# Patient Record
Sex: Female | Born: 1970
Health system: Southern US, Community
[De-identification: ages and names within clinical notes are randomized; demographics above are authoritative.]

## PROBLEM LIST (undated history)

## (undated) DIAGNOSIS — T7840XA Allergy, unspecified, initial encounter: Secondary | ICD-10-CM

## (undated) HISTORY — PX: BREAST BIOPSY: SHX20

## (undated) HISTORY — DX: Allergy, unspecified, initial encounter: T78.40XA

## (undated) HISTORY — PX: TONSILLECTOMY: SUR1361

---

## 2007-06-09 ENCOUNTER — Inpatient Hospital Stay (HOSPITAL_COMMUNITY): Admission: AD | Admit: 2007-06-09 | Discharge: 2007-06-09 | Payer: Self-pay | Admitting: Obstetrics and Gynecology

## 2007-06-09 ENCOUNTER — Encounter (INDEPENDENT_AMBULATORY_CARE_PROVIDER_SITE_OTHER): Payer: Self-pay | Admitting: Obstetrics and Gynecology

## 2009-07-19 ENCOUNTER — Encounter: Admission: RE | Admit: 2009-07-19 | Discharge: 2009-07-19 | Payer: Self-pay | Admitting: Orthopedic Surgery

## 2010-09-06 ENCOUNTER — Inpatient Hospital Stay (HOSPITAL_COMMUNITY): Admission: AD | Admit: 2010-09-06 | Payer: Self-pay | Admitting: Family Medicine

## 2010-11-10 ENCOUNTER — Inpatient Hospital Stay (HOSPITAL_COMMUNITY)
Admission: AD | Admit: 2010-11-10 | Discharge: 2010-11-12 | DRG: 775 | Disposition: A | Discharge status: Home / Self Care | Payer: PRIVATE HEALTH INSURANCE | Source: Ambulatory Visit | Attending: Obstetrics and Gynecology | Admitting: Obstetrics and Gynecology

## 2010-11-10 DIAGNOSIS — O09529 Supervision of elderly multigravida, unspecified trimester: Secondary | ICD-10-CM | POA: Diagnosis present

## 2010-11-10 LAB — RPR: RPR Ser Ql: NONREACTIVE

## 2010-11-10 LAB — CBC
MCH: 28.6 pg (ref 26.0–34.0)
MCV: 85.5 fL (ref 78.0–100.0)
RBC: 4.82 MIL/uL (ref 3.87–5.11)
RDW: 13.1 % (ref 11.5–15.5)

## 2010-11-11 LAB — CBC
HCT: 35.2 % — ABNORMAL LOW (ref 36.0–46.0)
Hemoglobin: 11.5 g/dL — ABNORMAL LOW (ref 12.0–15.0)
MCH: 28.1 pg (ref 26.0–34.0)
MCHC: 32.7 g/dL (ref 30.0–36.0)
MCV: 86.1 fL (ref 78.0–100.0)
Platelets: 186 10*3/uL (ref 150–400)
RDW: 13.2 % (ref 11.5–15.5)

## 2010-11-18 NOTE — Discharge Summary (Signed)
NAME:  Savannah Banks, Savannah Banks                     ACCOUNT NO.:  [id number]  MEDICAL RECORD NO.:  [id number]           PATIENT TYPE:  I  LOCATION:  9106                          FACILITY:  [facility]  PHYSICIAN:  Sherron Monday, MD        DATE OF BIRTH:  1971-02-22  DATE OF ADMISSION:  11/10/2010 DATE OF DISCHARGE:  11/12/2010                              DISCHARGE SUMMARY   ADMITTING DIAGNOSIS:  Intrauterine pregnancy at 39 weeks, in labor.  DISCHARGE DIAGNOSIS:  Intrauterine pregnancy at 39 weeks, in labor, delivered.  HISTORY OF PRESENT ILLNESS:  A 40 year old G3, P0-0-2-0, at 39 weeks, in labor, with increased contractions and discomfort with cervical change in the office.  She states she has had good fetal movement, no loss of fluid, no vaginal bleeding, contractions every 2-3 minutes.  Her pregnancy has been complicated by history of positive PPD which was treated at the [facility] Department.  She does have a history of miscarriage x2, was given vaginal progesterone in the first trimester.  Pregnancy is also complicated by limited English.  PAST MEDICAL HISTORY:  Significant for headache, positive PPD treated at the [facility] Department.  PAST SURGICAL HISTORY:  Tonsillectomy.  PAST OB/GYN HISTORY:  G1 and G2 are miscarriages, G3 is present pregnancy.  No history of any abnormal Pap smears or sexually transmitted diseases.  MEDICATIONS:  Include prenatal vitamins.  ALLERGIES:  No known drug allergies.  No latex allergies.  SOCIAL HISTORY:  Denies alcohol, tobacco, or drug use.  She is married.  FAMILY HISTORY:  Significant for cancer in grandfather, she is unsure of the type.  Ultrasound revealed an [facility] of November 17, 2010, with a normal anatomy scan with a questionable previa, female infant.  Followup ultrasound revealed previa was resolved.  PRENATAL LABS:  O positive, antibody screen negative, hemoglobin 13, Pap smear within normal limits, rubella immune, RPR  nonreactive, hepatitis C surface antigen negative, HIV negative, platelets 241,000, hemoglobin electrophoresis within normal limits, Gonorrhea negative, Chlamydia negative, cystic fibrosis screen declined, Glucola 125, group B strep negative.  On admission, she was afebrile.  Vital signs stable with a benign exam. On the exam changed in the office.  Fetal heart tones were in the 130s and reactive with contractions every 2 minutes.  Plan was for epidural for comfort as she desired it.  She progressed in labor, was given Pitocin to augment as she was unchanged from 6 to 7 for several hours. She progressed to complete, complete, +1-2, involuntary pushing.  With the aid of pacific interpreter.  The patient pushed very well for approximately 15-20 minutes to deliver through a nuchal cord.  While pushing the patient had severe variables but good variability.  Viable female infant was delivered at 2038 with Apgars of 9 at 1 minute and 9 at 5 minutes and a weight of 6 pounds and 9 ounces.  The placenta was expressed intact.  Second-degree perineal laceration with sphincter reinforcement, labial abrasion.  Perineal laceration was repaired with 2- 0 and 3-0 Vicryl in a typical fashion.  EBL was less than  500 mL.  Her postpartum course was relatively uncomplicated.  She remained afebrile. Her vital signs were stable throughout.  Her hemoglobin decreased from 13.8 to 11.5.  She was discharged home on postpartum day #2.  At this time, she was ambulating and voiding.  Her pain was well controlled. She had normal lochia.  She desired discharge to home.  She planned on using an IUD for postpartum care.  She is O positive, rubella immune. She plans to bottle-feed.  Her hemoglobin decreased from 13.8 to 11.5. This was all well tolerated.     Sherron Monday, MD     JB/MEDQ  D:  11/12/2010  T:  11/12/2010  Job:  [id number]  Electronically Signed by Sherron Monday MD on 11/18/2010 01:49:14 PM

## 2011-06-18 LAB — HCG, QUANTITATIVE, PREGNANCY: hCG, Beta Chain, Quant, S: 1809 — ABNORMAL HIGH

## 2014-07-18 ENCOUNTER — Other Ambulatory Visit: Payer: Self-pay | Admitting: Gynecology

## 2014-07-18 DIAGNOSIS — N644 Mastodynia: Secondary | ICD-10-CM

## 2014-07-31 ENCOUNTER — Ambulatory Visit
Admission: RE | Admit: 2014-07-31 | Discharge: 2014-07-31 | Disposition: A | Discharge status: Home / Self Care | Payer: No Typology Code available for payment source | Source: Ambulatory Visit | Attending: Gynecology | Admitting: Gynecology

## 2014-07-31 DIAGNOSIS — N644 Mastodynia: Secondary | ICD-10-CM

## 2014-08-07 ENCOUNTER — Ambulatory Visit
Admission: RE | Admit: 2014-08-07 | Discharge: 2014-08-07 | Disposition: A | Discharge status: Home / Self Care | Payer: No Typology Code available for payment source | Source: Ambulatory Visit | Attending: Gynecology | Admitting: Gynecology

## 2014-08-07 ENCOUNTER — Other Ambulatory Visit: Payer: Self-pay | Admitting: Gynecology

## 2014-08-07 ENCOUNTER — Ambulatory Visit: Admission: RE | Admit: 2014-08-07 | Payer: No Typology Code available for payment source | Source: Ambulatory Visit

## 2014-08-07 DIAGNOSIS — N644 Mastodynia: Secondary | ICD-10-CM

## 2014-08-07 DIAGNOSIS — N6320 Unspecified lump in the left breast, unspecified quadrant: Secondary | ICD-10-CM

## 2014-08-20 ENCOUNTER — Ambulatory Visit
Admission: RE | Admit: 2014-08-20 | Discharge: 2014-08-20 | Disposition: A | Discharge status: Home / Self Care | Payer: No Typology Code available for payment source | Source: Ambulatory Visit | Attending: Gynecology | Admitting: Gynecology

## 2014-08-20 ENCOUNTER — Other Ambulatory Visit: Payer: Self-pay | Admitting: Gynecology

## 2014-08-20 DIAGNOSIS — N6320 Unspecified lump in the left breast, unspecified quadrant: Secondary | ICD-10-CM

## 2014-11-04 ENCOUNTER — Emergency Department (HOSPITAL_COMMUNITY)
Admission: EM | Admit: 2014-11-04 | Discharge: 2014-11-04 | Disposition: A | Discharge status: Home / Self Care | Payer: No Typology Code available for payment source | Source: Home / Self Care | Attending: Family Medicine | Admitting: Family Medicine

## 2014-11-04 ENCOUNTER — Encounter (HOSPITAL_COMMUNITY): Payer: Self-pay | Admitting: Emergency Medicine

## 2014-11-04 DIAGNOSIS — R42 Dizziness and giddiness: Secondary | ICD-10-CM

## 2014-11-04 LAB — POCT I-STAT, CHEM 8
BUN: 24 mg/dL — AB (ref 6–23)
CALCIUM ION: 1.18 mmol/L (ref 1.12–1.23)
CHLORIDE: 103 mmol/L (ref 96–112)
CREATININE: 0.9 mg/dL (ref 0.50–1.10)
Glucose, Bld: 91 mg/dL (ref 70–99)
HCT: 43 % (ref 36.0–46.0)
Hemoglobin: 14.6 g/dL (ref 12.0–15.0)
Potassium: 3.7 mmol/L (ref 3.5–5.1)
Sodium: 140 mmol/L (ref 135–145)
TCO2: 22 mmol/L (ref 0–100)

## 2014-11-04 LAB — POCT URINALYSIS DIP (DEVICE)
Bilirubin Urine: NEGATIVE
Glucose, UA: NEGATIVE mg/dL
Ketones, ur: NEGATIVE mg/dL
NITRITE: NEGATIVE
PH: 7 (ref 5.0–8.0)
Protein, ur: NEGATIVE mg/dL
UROBILINOGEN UA: 1 mg/dL (ref 0.0–1.0)

## 2014-11-04 LAB — POCT PREGNANCY, URINE: Preg Test, Ur: NEGATIVE

## 2014-11-04 MED ORDER — MECLIZINE HCL 12.5 MG PO TABS: 12.5000 mg | ORAL_TABLET | Freq: Three times a day (TID) | ORAL | Status: DC | PRN

## 2014-11-04 NOTE — ED Provider Notes (Signed)
CSN: [id number]     Arrival date & time 11/04/14  1101 History   First MD Initiated Contact with Patient 11/04/14 1231     Chief Complaint  Patient presents with  . Dizziness   (Consider location/radiation/quality/duration/timing/severity/associated sxs/prior Treatment) HPI Comments: States she is feeling moderately better today, but decided to come and be evaluated anyway. Reports herself to be otherwise healthy. No recent illness or injury PCP: none Works as Scientist, forensic at [employer]. Non smoker No ETOH  Patient is a 44 y.o. female presenting with dizziness. The history is provided by the patient.  Dizziness Quality:  Room spinning Severity:  Moderate Onset quality:  Gradual Duration:  3 days Timing:  Intermittent Progression:  Improving Chronicity:  New (however, patient states she did have episode of same 8-10 years ago) Context: standing up   Relieved by:  Lying down Worsened by:  Sitting upright, turning head and standing up Ineffective treatments:  None tried Associated symptoms: nausea   Associated symptoms: no blood in stool, no chest pain, no diarrhea, no headaches, no hearing loss, no palpitations, no shortness of breath, no syncope, no tinnitus, no vision changes, no vomiting and no weakness     History reviewed. No pertinent past medical history. History reviewed. No pertinent past surgical history. No family history on file. History  Substance Use Topics  . Smoking status: Never Smoker   . Smokeless tobacco: Not on file  . Alcohol Use: No   OB History    No data available     Review of Systems  Constitutional: Negative.   HENT: Negative for congestion, ear discharge, ear pain, hearing loss, nosebleeds, postnasal drip, rhinorrhea, sinus pressure, sore throat, tinnitus and voice change.   Eyes: Negative.   Respiratory: Negative for cough, chest tightness, shortness of breath and wheezing.   Cardiovascular: Negative for chest pain, palpitations and  syncope.  Gastrointestinal: Positive for nausea. Negative for vomiting, abdominal pain, diarrhea, constipation, blood in stool and anal bleeding.  Endocrine: Negative for polydipsia, polyphagia and polyuria.  Genitourinary: Negative.   Musculoskeletal: Negative for back pain, joint swelling, gait problem, neck pain and neck stiffness.  Skin: Negative.   Neurological: Positive for dizziness. Negative for tremors, seizures, syncope, weakness, light-headedness, numbness and headaches.    Allergies  Review of patient's allergies indicates no known allergies.  Home Medications   Prior to Admission medications   Medication Sig Start Date End Date Taking? Authorizing Provider  meclizine (ANTIVERT) 12.5 MG tablet Take 1 tablet (12.5 mg total) by mouth 3 (three) times daily as needed for dizziness or nausea. 11/04/14   Annett Gula H Sakib Noguez, PA   BP 108/75 mmHg  Pulse 79  Temp(Src) 98.5 F (36.9 C) (Oral)  Resp 16  SpO2 99% Physical Exam  Constitutional: She is oriented to person, place, and time. She appears well-developed and well-nourished. No distress.  HENT:  Head: Normocephalic and atraumatic.  Right Ear: Hearing, tympanic membrane, external ear and ear canal normal. No middle ear effusion.  Left Ear: Hearing, tympanic membrane, external ear and ear canal normal.  No middle ear effusion.  Nose: Nose normal.  Mouth/Throat: Uvula is midline, oropharynx is clear and moist and mucous membranes are normal.  Eyes: Conjunctivae and EOM are normal. Pupils are equal, round, and reactive to light.  No nystagmus  Neck: Normal range of motion. Neck supple.  Cardiovascular: Normal rate, regular rhythm and normal heart sounds.   No ectopy  Pulmonary/Chest: Effort normal and breath sounds normal. No respiratory  distress. She has no wheezes.  Musculoskeletal: Normal range of motion.  Neurological: She is alert and oriented to person, place, and time. She has normal strength. No cranial nerve  deficit or sensory deficit. She displays a negative Romberg sign. Coordination and gait normal. GCS eye subscore is 4. GCS verbal subscore is 5. GCS motor subscore is 6.  Skin: Skin is warm and dry.  Psychiatric: She has a normal mood and affect. Her behavior is normal.  Nursing note and vitals reviewed.   ED Course  Procedures (including critical care time) Labs Review Labs Reviewed  POCT I-STAT, CHEM 8 - Abnormal; Notable for the following:    BUN 24 (*)    All other components within normal limits  POCT URINALYSIS DIP (DEVICE) - Abnormal; Notable for the following:    Hgb urine dipstick TRACE (*)    Leukocytes, UA LARGE (*)    All other components within normal limits  URINE CULTURE  POCT PREGNANCY, URINE    Imaging Review No results found.   MDM   1. Vertigo   Labs suggest mild to moderate dehydration with elevated BUN and urine spec grav > 1.030. H/H normal. UPT negative. Exam without focal neurological deficit or finding to suggest central vertigo. Symptoms reproducible with changes in head position. Will advise increased hydration at home and meclizine as prescribed with follow up if symptoms do not improve.   Lutricia Feil, [place] 11/04/14 1414

## 2014-11-04 NOTE — ED Notes (Addendum)
Dizziness for 3 days.  Denies pain.  Patient is laughing and very expressive.  No nausea, no vomiting, no pain

## 2014-11-04 NOTE — Discharge Instructions (Signed)
Chng m?t (Dizziness) Chng m?t l m?t v?n ?? ph? bi?n. ? l c?m gic khng ?n ??nh ho?c chong vng. Qu v? c th? c?m th?y nh? s?p ng?t. Chng m?t c th? d?n ??n ch?n th??ng n?u qu v? v?p ho?c t ng. B?t k? ?? tu?i no c?ng c th? b? chng m?t, nh?ng chng m?t ph? bi?n h?n ? ng??i cao tu?i. NGUYN NHN  Chng m?t c th? do nhi?u nguyn nhn khc nhau gy ra, bao g?m:  V?n ?? v?i tai gi?a.  ??ng qu lu.  Nhi?m trng.  Ph?n ?ng d? ?ng.  Lo ha.  M?t ph?n ?ng c?m xc v?i m?t v?n ?? g ?, ch?ng h?n nh? khi nhn th?y mu.  Tc d?ng ph? c?a thu?c.  M?t m?i.  V?n ?? v?i tu?n hon ho?c huy?t p.  S? d?ng qu nhi?u r??u ho?c thu?c, ho?c ma ty b?t h?p php.  Th? qu nhanh (t?ng thng kh).  Nh?p tim khng ??u (lo?n nh?p tim).  S? l??ng h?ng c?u th?p (thi?u mu).  Mang [place].  Nn m?a, tiu ch?y, s?t ho?c cc b?nh l khc gy m?t n??c cho c? th? (m?t n??c).  B?nh l ho?c tnh tr?ng b?nh l nh? b?nh Parkinson, huy?t p cao (t?ng huy?t p), ti?u ???ng v cc v?n ?? v? tuy?n gip.  Ti?p xc v?i nhi?t ?? cao. CH?N ?ON  Chuyn gia ch?m [place] s?c kh?e s? h?i v? cc tri?u ch?ng, khm th?c th?, v lm ?i?n tm ?? (ECG) ?? ghi l?i ho?t ??ng ?i?n c?a tim qu v?. Chuyn gia ch?m [place] s?c kh?e c?ng c th? th?c hi?n cc ki?m tra tim ho?c xt nghi?m mu khc ?? xc ??nh nguyn nhn gy chng m?t c?a qu v?. Nh?ng ki?m tra ny c th? bao g?m:  Siu m tim qua thnh ng?c (TTE). Trong siu m tim, sng m thanh ???c s? d?ng ?? ?nh gi cch th?c dng mu l?u thng qua tim.  Siu m tim qua th?c qu?n (TEE).  Theo di tim. Bi?n php ny cho php chuyn gia ch?m [place] s?c kh?e theo di nh?p v nh?p ?i?u c?a tim theo th?i gian th?c.  My ?i?n tim. ?y l m?t thi?t b? c?m tay ghi l?i nh?p tim v c th? gip ch?n ?on r?i lo?n nh?p tim. N cho php chuyn gia ch?m [place] s?c kh?e theo di ho?t ??ng tim c?a qu v? trong vi ngy n?u c?n.  Th? nghi?m g?ng s?c b?ng cch t?p th? d?c ho?c b?ng cch s?  d?ng thu?c lm cho tim ??p nhanh h?n. ?I?U TR?  ?i?u tr? chng m?t ph? thu?c vo nguyn nhn gy ra tri?u ch?ng v c th? khc nhau r?t nhi?u. H??NG D?N CH?M [place] T?I NH   U?ng ?? n??c ?? gi? cho n??c ti?u trong ho?c vng nh?t. ?i?u ny ??c bi?t quan tr?ng trong th?i ti?t r?t nng. ? ng??i gi, n c?ng l quan tr?ng trong th?i ti?t l?nh.  N?u qu v? b? chng m?t do thu?c, hy s? d?ng thu?c chnh xc theo ch? d?n. Khi s? d?ng thu?c tr? huy?t p, ?i?u ??c bi?t quan tr?ng l ??ng ln t? t?.  T? ??ng d?y th?t ch?m v ?n ??nh cho ??n khi qu v? c?m th?y khng c v?n ?? g.  Vo bu?i sng, ??u tin l ng?i d?y bn c?nh gi??ng. Khi qu v? c?m th?y ?n ? t? th? ny, hy ??ng d?y ch?m d?y trong khi n?m l?y m?t ci g ? cho ??n khi  qu v? th?y th?ng b?ng.  C? ??ng chn th??ng xuyn n?u qu v? c?n ??ng ? m?t n?i trong m?t th?i gian di. Co v th? gin cc c? b?p ? chn qu v? khi ??ng.  Nh? ai ? ? l?i cng v?i qu v? trong 1 - 2 ngy n?u chng m?t v?n ch?a ??. Th?c hi?n ?i?u ny cho ??n khi qu v? c?m th?y mnh ?? kh?e ?? ? m?t mnh. Yu c?u ng??i ny g?i cho chuyn gia ch?m [place] y t? c?a qu v? n?u h? nh?n th?y nh?ng thay ??i trong qu v? l ?ng quan tm.  Khng li xe ho?c v?n hnh my mc n?ng n?u qu v? c?m th?y chng m?t.  Khng u?ng r??u. NGAY L?P T?C ?I KHM N?U:   Qu v? b? hoa m?t ho?c chng m?t n?ng h?n.  Qu v? c?m th?y bu?n nn ho?c nn.  Qu v? kh ni, kh ?i b?, ho?c s? d?ng cnh tay, bn tay ho?c chn.  Qu v? c?m th?y y?u ?t.  Qu v? suy ngh? khng r rng ho?c kh ??t cu. M?t ng??i b?n ho?c ng??i trong gia ?nh c th? nh?n th?y ?i?u ny.  Qu v? b? ?au ng?c, ?au b?ng, kh th? ho?c v m? hi.  Th? l?c c?a qu v? thay ??i.  Qu v? th?y b?t k? ch? ch?y mu no.  Qu v? b? tc d?ng ph? c?a thu?c c v? nh? tr? nn nghim tr?ng h?n ch? khng ph?i l ?? h?n. ??M B?O QU V?:   Hi?u r cc h??ng d?n ny.  S? theo di tnh tr?ng c?a mnh.  S? yu c?u tr? gip ngay l?p t?c  n?u qu v? c?m th?y khng kh?e ho?c th?y tr?m tr?ng h?n. Document Released: 08/13/2011 Document Revised: 08/29/2013 [facility] Patient Information 2015 [place]. This information is not intended to replace advice given to you by your health care provider. Make sure you discuss any questions you have with your health care provider.  Ch?ng Chng M?t (Vertigo) Chng m?t c ngh?a l b?n c?m th?y nh? b?n hay m?i th? xung quanh b?n ?ang di chuy?n khi th?c t? khng ph?i nh? v?y. Chng m?t c th? nguy hi?m n?u n x?y ra khi b?n ?ang ? n?i lm vi?c, li xe ho?c th?c hi?n cc ho?t ??ng kh kh?n. NGUYN NHN Chng m?t x?y ra khi c s? xung ??t c?a cc tn hi?u ???c g?i ln no t? h? th?ng th? gic v h? th?ng c?m gic trong c? th?. C nhi?u nguyn nhn khc nhau gy chng m?t, bao g?m:  Nhi?m trng, ??c bi?t l ? tai trong.  Ph?n ?ng x?u v?i m?t lo?i thu?c ho?c l?m d?ng r??u v thu?c men.  Cai ma ty ho?c r??u.  Thay ??i t? th? nhanh, ch?ng h?n nh? n?m xu?ng ho?c l?n mnh trn gi??ng.  ?au n?a ??u.  L?u l??ng mu ln no gi?m.  p l?c trong no t?ng sau ch?n th??ng ??u, nhi?m trng, kh?i u ho?c ch?y mu. TRI?U CH?NG B?n c th? c?m th?y c v? nh? l th? gi?i ?ang quay cu?ng ho?c b?n ?ang r?i xu?ng ??t. B?i v s? cn b?ng c?a b?n b? ??o l?n, chng m?t c th? khi?n b?n bu?n nn v nn m?a. B?n c th? c c? ??ng m?t ngoi  mu?n (gi?t c?u m?t). CH?N ?ON Chng m?t th??ng ???c ch?n ?on b?ng cch khm th?c th?. N?u khng xc ??nh ???c nguyn nhn gy chng m?t, chuyn gia ch?m [place] s?c  kh?e c th? th?c hi?n cc xt nghi?m hnh ?nh, ch?ng h?n nh? ch?p MRI (ch?p c?ng h??ng t?). ?I?U TR? H?u h?t cc tr??ng h?p chng m?t t? chng h?t m khng c?n ?i?u tr?Marland Kitchen Ty thu?c vo nguyn nhn, chuyn gia ch?m [place] s?c kh?e c th? k m?t s? thu?c nh?t ??nh. N?u chng m?t c lin quan ??n cc v?n ?? v? v? tr c? th?, chuyn gia ch?m [place] s?c kh?e c th? ?? xu?t cc ??ng tc ho?c ph??ng php ?? kh?c ph?c v?n ?? ny.  Trong m?t s? tr??ng h?p hi?m g?p, n?u chng m?t do m?t s? v?n ?? v? tai trong, b?n c th? c?n ph?u thu?t. H??NG D?N CH?M [place] T?I NH  Lm theo h??ng d?n c?a bc s?Hessie Diener li xe.  Trnh v?n hnh my mc n?ng.  Trnh th?c hi?n b?t c? nhi?m v? no c th? gy nguy hi?m cho b?n ho?c nh?ng ng??i khc trong tnh tr?ng chng m?t.  Ni cho chuyn gia ch?m [place] s?c kh?e bi?t n?u b?n nh?n th?y lo?i thu?c no ? d??ng nh? l nguyn nhn gy chng m?t cho b?n. M?t s? thu?c ???c dng ?? ?i?u tr? tnh tr?ng chng m?t th?c s? c th? lm cho chng t?i t? h?n ? m?t s? ng??i. HY NGAY L?P T?C ?I KHM N?U:  Thu?c c v? nh? khng c tc d?ng lm h?t c?n chng m?t ho?c lm b?nh n?ng thm.  B?n c v?n ?? v? ni chuy?n, ?i b?, ?m y?u ho?c vi?c s? d?ng cnh tay, bn tay hay chn.  B?n b? ?au ??u r?t nhi?u.  Bu?n nn ho?c nn lin t?c ho?c n?ng h?n.  B?n b? thay ??i th? gic.  Thnh vin trong gia ?nh nh?n th?y cc thay ??i hnh vi.  Tnh tr?ng c?a b?n tr? nn n?ng h?n. ??M B?O B?N:  Hi?u cc h??ng d?n ny.  S? theo di tnh tr?ng c?a mnh.  S? yu c?u tr? gip ngay l?p t?c n?u b?n c?m th?y khng ?? ho?c tnh tr?ng tr?m tr?ng h?n. Document Released: 08/24/2005 Document Revised: 04/26/2013 [facility] Patient Information 2015 [place]. This information is not intended to replace advice given to you by your health care provider. Make sure you discuss any questions you have with your health care provider.

## 2014-11-06 LAB — URINE CULTURE: Colony Count: 3000

## 2015-02-05 ENCOUNTER — Ambulatory Visit (INDEPENDENT_AMBULATORY_CARE_PROVIDER_SITE_OTHER): Payer: 59 | Admitting: Family Medicine

## 2015-02-05 ENCOUNTER — Encounter: Payer: Self-pay | Admitting: Family Medicine

## 2015-02-05 VITALS — BP 107/75 | HR 74 | Temp 97.5°F | Wt 126.1 lb

## 2015-02-05 DIAGNOSIS — D242 Benign neoplasm of left breast: Secondary | ICD-10-CM | POA: Diagnosis not present

## 2015-02-05 DIAGNOSIS — R42 Dizziness and giddiness: Secondary | ICD-10-CM | POA: Diagnosis not present

## 2015-02-05 DIAGNOSIS — Z0000 Encounter for general adult medical examination without abnormal findings: Secondary | ICD-10-CM | POA: Diagnosis not present

## 2015-02-05 DIAGNOSIS — D249 Benign neoplasm of unspecified breast: Secondary | ICD-10-CM | POA: Insufficient documentation

## 2015-02-05 DIAGNOSIS — Z23 Encounter for immunization: Secondary | ICD-10-CM | POA: Diagnosis not present

## 2015-02-05 LAB — COMPREHENSIVE METABOLIC PANEL
ALT: 29 U/L (ref 0–35)
AST: 22 U/L (ref 0–37)
Albumin: 4.4 g/dL (ref 3.5–5.2)
Alkaline Phosphatase: 56 U/L (ref 39–117)
BILIRUBIN TOTAL: 0.6 mg/dL (ref 0.2–1.2)
BUN: 18 mg/dL (ref 6–23)
CALCIUM: 9 mg/dL (ref 8.4–10.5)
CO2: 24 meq/L (ref 19–32)
Chloride: 105 mEq/L (ref 96–112)
Creat: 0.53 mg/dL (ref 0.50–1.10)
GLUCOSE: 86 mg/dL (ref 70–99)
Potassium: 4.3 mEq/L (ref 3.5–5.3)
Sodium: 140 mEq/L (ref 135–145)
TOTAL PROTEIN: 7.2 g/dL (ref 6.0–8.3)

## 2015-02-05 LAB — CBC WITH DIFFERENTIAL/PLATELET
BASOS ABS: 0 10*3/uL (ref 0.0–0.1)
BASOS PCT: 0 % (ref 0–1)
EOS ABS: 0.1 10*3/uL (ref 0.0–0.7)
Eosinophils Relative: 1 % (ref 0–5)
HCT: 41.8 % (ref 36.0–46.0)
HEMOGLOBIN: 13.7 g/dL (ref 12.0–15.0)
Lymphocytes Relative: 18 % (ref 12–46)
Lymphs Abs: 1.8 10*3/uL (ref 0.7–4.0)
MCH: 27.9 pg (ref 26.0–34.0)
MCHC: 32.8 g/dL (ref 30.0–36.0)
MCV: 85.1 fL (ref 78.0–100.0)
MONOS PCT: 5 % (ref 3–12)
MPV: 9.4 fL (ref 8.6–12.4)
Monocytes Absolute: 0.5 10*3/uL (ref 0.1–1.0)
Neutro Abs: 7.8 10*3/uL — ABNORMAL HIGH (ref 1.7–7.7)
Neutrophils Relative %: 76 % (ref 43–77)
Platelets: 276 10*3/uL (ref 150–400)
RBC: 4.91 MIL/uL (ref 3.87–5.11)
RDW: 12.9 % (ref 11.5–15.5)
WBC: 10.2 10*3/uL (ref 4.0–10.5)

## 2015-02-05 LAB — LIPID PANEL
Cholesterol: 208 mg/dL — ABNORMAL HIGH (ref 0–200)
HDL: 46 mg/dL (ref >=46)
LDL Cholesterol: 148 mg/dL — ABNORMAL HIGH (ref 0–99)
Total CHOL/HDL Ratio: 4.5 Ratio
Triglycerides: 71 mg/dL (ref <150)
VLDL: 14 mg/dL (ref 0–40)

## 2015-02-05 LAB — TSH: TSH: 1.006 u[IU]/mL (ref 0.350–4.500)

## 2015-02-05 NOTE — Patient Instructions (Addendum)
It was a pleasure to meet you today.  Dr. Ree Kida will send you a letter with your lab results. We will discuss the results in more detail at your next visit.  Please schedule a well women exam/physical in the next 2-4 weeks.   You may take Ibuprofen for your breast pain. Heat may also help.

## 2015-02-05 NOTE — Assessment & Plan Note (Addendum)
Left outer quadrant 1inch diameter fibroadenoma that is tender to palpation. No change in size since evaluation by biopsy in 08/20/14, but patient reports increased tenderness. Reassured against possibility of malignancy since ultrasound-guided biopsy in December.  - advised use of NSAIDs and heating pad as needed for pain  Dois Davenport, MS3  I agree with the medical student assessment and plan.  -reassurance given about benign etiology -encouraged prn Ibuprofen and heat/ice prn  Dossie Arbour MD

## 2015-02-05 NOTE — Progress Notes (Signed)
Subjective:     Patient ID: Savannah Banks, female   DOB: 08-26-1971, 44 y.o.   MRN: [id number] Savannah Banks, MS3 HPI [name] is a healthy 44 year old female who presents today for a new patient visit. She is seen with her 73-year-old daughter and sister-in-law. A [place] interpreter was present for the visit.   PMH/PSH/FH/SH reviewed. Updated and Reviewed in EPIC. Reviewed new patient health history (see scanned document).   Left Breast Lump: Patient reports a one-year history of a lump in the lower outer quadrant of her left breast. She reports a 10/10 pain on palpation and a 1/10 pain at other times. The lump has not increased in size. She denies nipple discharge and bleeding. She denies other lumps in either breast. She had a diagnostic mammogram, ultrasound, and biopsy in December 2015 that showed a fibroadenoma. She has not attempted any medications for pain control or heat/ice. Pain is worst around the time of her period.   Dizziness: Patient reports episodes of dizziness ("room spinning") 2-3 times per week lasting up to an hour. There are no triggers. She feels nauseous during the episodes, but denies vomiting, chest pain, shortness of breath, diaphoresis, loss of bowel or bladder continence, and tremors. She denies loss of consciousness, but does report occasions where her vision completely blacks out during these episodes. She denies hearing changes and tinnitus, but reports a "windy sound" in both ears. She denies vision changes. She was seen in urgent care in February for the same reason and was told she was experiencing vertigo exacerbated by dehydration and anemia. They advised she eat more red meat, which she did for a few weeks and felt better. However, she did not continue due to fear of gaining weight. She reports additional lightheadedness when she stands from sitting. She denies relieving or exacerbating factors.  OB-GYN History: [id number]. Patient had an IUD placed at a different office  last year. She has not had a menstrual since. She had a Pap at another office earlier this year that patient states is normal.   Social History: Patient lives with her husband and daughter. She works in a Company secretary. She denies alcohol, tobacco, and drug use.   Review of Systems  Constitutional: Negative for fever, chills and fatigue.  Respiratory: Negative for cough and shortness of breath.   Cardiovascular: Negative for chest pain.  Gastrointestinal: Negative for nausea, vomiting and diarrhea.  Neurological: Positive for dizziness. Negative for light-headedness.   See HPI.     Objective:   Physical Exam BP 107/75 mmHg  Pulse 74  Temp(Src) 97.5 F (36.4 C) (Oral)  Wt 126 lb 1.6 oz (57.199 kg) Gen: well-appearing female in NAD HEENT: PERRLA, EOMI. Conjunctival pallor. Clear TMs bilaterally. Weber and Rinne testing negative.Clear oropharynx. Neck: No cervical or submandibular LAD. No thyromegaly.  CV: RRR, no MRG, nl S1 and S2. No lower extremity edema. Pulm: CTA bilaterally, no wheezes or crackles. Abdomen: normoactive bowel sounds, soft and non-tender, no HSM, no scars present Neuro: CN II-XII intact, sensation of all extremities intact to light touch, 5/5 grip strength, 5/5 strength in all extremities Breast Exam: soft, tender, mobile mass approx 1 cm diameter in 5-'clock position on left breast.No masses or tenderness of right breast. No nipple discharge or skin changes. No axillary LAD bilaterally. Ext: No edmea, 2+ radial and DP pulses bilaterally Skin: no rash, no cyanosis      Assessment:     Please see Problem List.     Plan:  Please see Problem List.       I personally saw and evaluated the patient. I have reviewed the medical student note and agree with the documentation. I personally completed the physical exam. Additions to the medical student note are made in blue.   Dossie Arbour MD

## 2015-02-05 NOTE — Assessment & Plan Note (Signed)
Patient presents to establish care. -routine lab obtained (CMP, CBC, Lipid Profile, TSH, HIV) -Tdap provided -get records of previous GYN follow up

## 2015-02-05 NOTE — Assessment & Plan Note (Addendum)
Patient reports 2-3 episodes of dizziness per week lasting up to an hour with associated nausea. Denies chest pain, shortness of breath, diaphoresis, and bowel/bladder incontinence. Vertigo is high on the differential due to patient's description of room spinning and exacerbation of symptoms with movement. Orthostatic hypotension is also possible, likely due to patient's low blood pressure (107/75), but less likely due to symptom onset when patient has not changed positions and length of symptom duration. Iron deficiency anemia is on the differential due to patient's history of mild anemia (Hb 11) and conjunctival pallor on exam.  - orthostatics in office: unremarkable - CBC with differential   Dois Davenport, MS3  I agree with the medical student documentation. Clinically appears to be benign vertigo. No associated cardiac symptoms. No tinnitus and normal weber/rinne suggest against Menier's. Orthostatics unremarkable. Anemia and Hypothyroid are in the differential as well. -check CBC, CMP, TSH -return visit scheduled  Dossie Arbour MD

## 2015-02-06 ENCOUNTER — Telehealth: Payer: Self-pay | Admitting: Family Medicine

## 2015-02-06 LAB — HIV ANTIBODY (ROUTINE TESTING W REFLEX): HIV 1&2 Ab, 4th Generation: NONREACTIVE

## 2015-02-06 NOTE — Telephone Encounter (Signed)
Pt called back. Her mammogram was done at [facility] She also gave me the name of [facility]. Michela Pitcher she went to see them in Jan 2016 Says nothing was found. Breast still hurts. ( i really am to sure what she wanted me to do

## 2015-02-07 ENCOUNTER — Encounter: Payer: Self-pay | Admitting: Family Medicine

## 2015-02-08 NOTE — Telephone Encounter (Signed)
Nursing staff - I believe that deseree had the patient sign a release of information form during her visit. Please send the form to the OB GYN on [address] that the patient said she went to, thanks

## 2015-02-11 ENCOUNTER — Encounter: Payer: Self-pay | Admitting: Family Medicine

## 2015-02-11 DIAGNOSIS — Z975 Presence of (intrauterine) contraceptive device: Secondary | ICD-10-CM | POA: Insufficient documentation

## 2015-02-11 NOTE — Progress Notes (Signed)
Reviewed Records from [facility]. Updated Epic as appropriate. See scanned document.

## 2015-03-04 ENCOUNTER — Ambulatory Visit (INDEPENDENT_AMBULATORY_CARE_PROVIDER_SITE_OTHER): Payer: 59 | Admitting: Family Medicine

## 2015-03-04 VITALS — BP 108/69 | HR 80 | Temp 98.5°F | Wt 127.3 lb

## 2015-03-04 DIAGNOSIS — R202 Paresthesia of skin: Secondary | ICD-10-CM

## 2015-03-04 DIAGNOSIS — Z0000 Encounter for general adult medical examination without abnormal findings: Secondary | ICD-10-CM | POA: Diagnosis not present

## 2015-03-04 DIAGNOSIS — R200 Anesthesia of skin: Secondary | ICD-10-CM

## 2015-03-04 DIAGNOSIS — R42 Dizziness and giddiness: Secondary | ICD-10-CM | POA: Diagnosis not present

## 2015-03-04 DIAGNOSIS — E785 Hyperlipidemia, unspecified: Secondary | ICD-10-CM | POA: Diagnosis not present

## 2015-03-04 MED ORDER — MECLIZINE HCL 12.5 MG PO TABS: 12.5000 mg | ORAL_TABLET | Freq: Three times a day (TID) | ORAL | Status: DC | PRN

## 2015-03-04 NOTE — Assessment & Plan Note (Signed)
Patient has hyperlipidemia however ASCVD is only 0.7% for the next 10 years -Discussed lifestyle changes including diet and exercise -Recheck cholesterol in 6-12 months

## 2015-03-04 NOTE — Patient Instructions (Signed)
It was nice to see you today.  Your labs were normal except for a mildly elevated cholesterol.  I recommend increased fish, dairy products, fruits, and vegetables in your diet. Please start to walk 3 times a week for 30 minutes.   The numbness in your right hand is due to your job. Please attempt heat and ice over your wrist and elbow.   Dizziness - Meclizine has been sent to your pharmacy.   Return in one month for follow up.

## 2015-03-04 NOTE — Progress Notes (Signed)
   Subjective:    Patient ID: Savannah Banks, female    DOB: April 18, 1971, 44 y.o.   MRN: [id number]  HPI 44 year old female presents for follow-up of dizziness and lab work. [place] interpreter present  Labs-discussed lab work with patient, patient had borderline elevated cholesterol however ASCVD risk score was 0.7% over the next 10 years  Diet-patient reports mostly eating chicken and rice, very occasional fruits, vegetables, and dairy products. Occasional red meat.  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  Respiratory: Negative for cough and shortness of breath.   Cardiovascular: Negative for chest pain and leg swelling.  Gastrointestinal: Negative for nausea and diarrhea.  Neurological: Positive for dizziness and numbness. Negative for tremors, seizures, syncope, speech difficulty, weakness and light-headedness.       Objective:   Physical Exam Vitals: Reviewed Gen.: Pleasant female, no acute distress HEENT: Normocephalic, pupils are equal round and reactive to light, extraocular movements are intact, no scleral icterus, bilateral TMs are pearly-gray without bulging or erythema, nasal septum midline, no rhinorrhea, moist mucous members, uvula  midline, neck was supple, no anterior posterior cervical lymphadenopathy Cardiac: Regular in rhythm, S1 and S2 present, no murmurs, no heaves or thrills Respiratory: Clear to patient bilaterally, normal effort Neuro: Subjective decreased sensation over the distal right third through fifth fingers, grip strength 5 over 5, arm flexion and extension bilaterally 5 over 5 MSK: No neck tenderness, cervical range of motion is full, negative Spurling bilaterally  Reviewed recent lab work     Assessment & Plan:  Please see problem specific assessment and plan.

## 2015-03-04 NOTE — Assessment & Plan Note (Signed)
Patient had OB/GYN physical completed in November 2015. Patient wishes to continue GYN care here at [facility]. Patient will need to return for physical in November 2016.

## 2015-03-04 NOTE — Assessment & Plan Note (Signed)
Patient reports one-year history of right distal third through fifth digit numbness. Exam most consistent with ulnar radiculopathy secondary to overuse injury. Patient does work as a Scientist, forensic. -Attempt conservative management with heat, ice, and when necessary ibuprofen -If symptoms persist or worsen could consider nerve conduction studies

## 2015-03-04 NOTE — Assessment & Plan Note (Signed)
Patient continues to have intermittent dizziness most consistent with vertigo. No associated cardiac signs or symptoms. -Will attempt trial of meclizine

## 2015-04-02 ENCOUNTER — Encounter: Payer: Self-pay | Admitting: Family Medicine

## 2015-04-02 ENCOUNTER — Ambulatory Visit (INDEPENDENT_AMBULATORY_CARE_PROVIDER_SITE_OTHER): Payer: 59 | Admitting: Family Medicine

## 2015-04-02 VITALS — BP 102/67 | HR 74 | Temp 98.3°F | Wt 128.4 lb

## 2015-04-02 DIAGNOSIS — R202 Paresthesia of skin: Secondary | ICD-10-CM | POA: Diagnosis not present

## 2015-04-02 DIAGNOSIS — R42 Dizziness and giddiness: Secondary | ICD-10-CM

## 2015-04-02 DIAGNOSIS — R200 Anesthesia of skin: Secondary | ICD-10-CM

## 2015-04-02 NOTE — Progress Notes (Signed)
   Subjective:    Patient ID: Savannah Banks, female    DOB: [birth year], 44 y.o.   MRN: [id number]  HPI  44 y/o female presents for follow up of dizziness and hand numbness. Interpretor present.   Dizziness - see previous notes for description, prescribed meclizine at last visit, symptoms are mildly better, the meclizine improves her symptoms (she takes when she is symptomatic), she continues to have sensation of room spinning, only lasts a few minutes, no syncope or loc, does report some associated vision changes only during the episodes, no hearing changes or tinnitus, no nausea/emesis, occurred 3 times last week, no relation to time of day/location/position.   Hand numbness - patient presents for follow up of hand numbness, occurs in right hand 3rd-5th digits, does radiate to arm in ulnar nerve distribution, has not applied heat/ice or taken ibuprofen as discussed at last visit   Review of Systems  Constitutional: Negative for fever, chills and fatigue.  Neurological: Positive for dizziness and numbness. Negative for seizures, syncope and weakness.       Objective:   Physical Exam Vitals: reviewed Gen: pleasant female, NAD HEENT: normocephalic, PERRL, EOMI Neuro: altered sensation in ulnar neve distribution, grip strength intact bilaterally     Assessment & Plan:  Please see problem specific assessment and plan.

## 2015-04-02 NOTE — Patient Instructions (Signed)
It was nice to see you today.  Please continue the Meclizine for the dizziness.  Please use padding/folded towel under your arms when you are at work to relieve the nerve pain. Please return if your symptoms persist.

## 2015-04-02 NOTE — Assessment & Plan Note (Signed)
Improved with Meclizine.  - continue Meclizine prn - return precautions discussed

## 2015-04-02 NOTE — Assessment & Plan Note (Signed)
Patient continues to have symptoms consistent with ulnar nerve neuropathy related to overuse injury. -encouraged padding at work station -patient declined gabapentin at this time

## 2015-07-25 ENCOUNTER — Ambulatory Visit (HOSPITAL_COMMUNITY)
Admission: RE | Admit: 2015-07-25 | Discharge: 2015-07-25 | Disposition: A | Discharge status: Home / Self Care | Payer: 59 | Source: Ambulatory Visit | Attending: Family Medicine | Admitting: Family Medicine

## 2015-07-25 ENCOUNTER — Encounter: Payer: Self-pay | Admitting: Family Medicine

## 2015-07-25 ENCOUNTER — Ambulatory Visit (INDEPENDENT_AMBULATORY_CARE_PROVIDER_SITE_OTHER): Payer: 59 | Admitting: Family Medicine

## 2015-07-25 VITALS — BP 111/75 | HR 85 | Temp 98.2°F | Wt 129.0 lb

## 2015-07-25 DIAGNOSIS — M549 Dorsalgia, unspecified: Secondary | ICD-10-CM | POA: Diagnosis not present

## 2015-07-25 DIAGNOSIS — R208 Other disturbances of skin sensation: Secondary | ICD-10-CM | POA: Diagnosis not present

## 2015-07-25 DIAGNOSIS — M62830 Muscle spasm of back: Secondary | ICD-10-CM

## 2015-07-25 DIAGNOSIS — R0602 Shortness of breath: Secondary | ICD-10-CM | POA: Diagnosis not present

## 2015-07-25 DIAGNOSIS — R0789 Other chest pain: Secondary | ICD-10-CM | POA: Diagnosis not present

## 2015-07-25 DIAGNOSIS — R202 Paresthesia of skin: Secondary | ICD-10-CM

## 2015-07-25 DIAGNOSIS — R200 Anesthesia of skin: Secondary | ICD-10-CM

## 2015-07-25 MED ORDER — GABAPENTIN 100 MG PO CAPS: 100.0000 mg | ORAL_CAPSULE | Freq: Three times a day (TID) | ORAL | Status: DC

## 2015-07-25 MED ORDER — IBUPROFEN 600 MG PO TABS: 600.0000 mg | ORAL_TABLET | Freq: Three times a day (TID) | ORAL | Status: DC | PRN

## 2015-07-25 NOTE — Progress Notes (Signed)
   Subjective:    Patient ID: Savannah Banks, female    DOB: 05-02-1971, 44 y.o.   MRN: [id number]  HPI 44 y/o female presents for evaluation of chest/back pain and arm numbness. History obtained using the [place] interpreter.  Chest pain - constant over the past month, started below the left breast and now has moved to the right side of chest, no relation to exertion, no rash, no injury, patient reports some shortness of breath without cough at nighttime, no orthopnea, no PND, she has taken Tylenol which provided some relief of symptoms, she has not attempted any other over-the-counter medications or heat/ice, no lower extremity swelling  Back pain - bilateral thoracic, constant over the past month, some relief with Tylenol when necessary, she has not attempted any heat or ice to the area  Bilateral hand and forearm numbness - patient has a 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  Respiratory: Negative for cough and choking.        Objective:   Physical Exam Vitals: Reviewed Gen.: Pleasant the knees female, mildly anxious MSK: Thoracic spine-bilateral paraspinal tenderness, no midline tenderness; chest-bilateral anterior rib pain just below the breasts; cervical range of motion is full Cardiac: Reproducible chest pain over anterior chest Breast exam: Completed with chaperone, normal breast exam, no nipple discharge, no masses, no axillary lymphadenopathy Neuro: Strength 5/5 to bilateral arm abduction/abduction/flexion/extension/wrist flexion/wrist  extension; bilateral Spurling maneuver negative for reproduction of symptoms Skin: No rash  Reviewed breast biopsy performed in December 2015 which showed fibroadenoma, mammogram in March 2016 was negative     Assessment & Plan:  Numbness of fingers of both hands Bilateral  distal 4th - 5th digits and ulnar distribution of forearms. Clinical exam of cervical spine is unremarkable. 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 -Initiate therapy with gabapentin 100 mg 3 times a day  Muscle spasm of back Patient presents with bilateral paraspinal thoracic back pain likely secondary to muscle spasm. -Initiate trial of ibuprofen 600 mg 3 times a day -Check chest x-ray to rule out pulmonary etiology -Patient to apply heat and ice as tolerated  Atypical chest pain Atypical chest pain. Suspect musculoskeletal etiology. -Chest x-ray to rule out pulmonary etiology -Initiate therapy with ibuprofen 600 mg 3 times a day

## 2015-07-25 NOTE — Assessment & Plan Note (Signed)
Bilateral  distal 4th - 5th digits and ulnar distribution of forearms. Clinical exam of cervical spine is unremarkable. 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 -Initiate therapy with gabapentin 100 mg 3 times a day

## 2015-07-25 NOTE — Patient Instructions (Signed)
It was nice to see you today.  Back pain/chest pain - I think this is related to muscle spasm, please start to take ibuprofen 600 mg three times per day as needed for pain, apply heat as needed  Arm numbness - I think this is related to your job, please start Gabapentin 100 mg three times per day (nerve medication).  Please go directly to the hospital to get a chest xray. Dr. Ree Kida will call you with your results.

## 2015-07-25 NOTE — Assessment & Plan Note (Signed)
Patient presents with bilateral paraspinal thoracic back pain likely secondary to muscle spasm. -Initiate trial of ibuprofen 600 mg 3 times a day -Check chest x-ray to rule out pulmonary etiology -Patient to apply heat and ice as tolerated

## 2015-07-25 NOTE — Assessment & Plan Note (Signed)
Atypical chest pain. Suspect musculoskeletal etiology. -Chest x-ray to rule out pulmonary etiology -Initiate therapy with ibuprofen 600 mg 3 times a day

## 2015-10-24 ENCOUNTER — Other Ambulatory Visit: Payer: Self-pay

## 2015-10-24 ENCOUNTER — Ambulatory Visit (INDEPENDENT_AMBULATORY_CARE_PROVIDER_SITE_OTHER): Payer: BLUE CROSS/BLUE SHIELD | Admitting: Family Medicine

## 2015-10-24 ENCOUNTER — Ambulatory Visit (HOSPITAL_COMMUNITY)
Admission: RE | Admit: 2015-10-24 | Discharge: 2015-10-24 | Disposition: A | Discharge status: Home / Self Care | Payer: BLUE CROSS/BLUE SHIELD | Source: Ambulatory Visit | Attending: Family Medicine | Admitting: Family Medicine

## 2015-10-24 ENCOUNTER — Encounter: Payer: Self-pay | Admitting: Family Medicine

## 2015-10-24 VITALS — BP 96/64 | HR 67 | Temp 97.6°F | Wt 127.0 lb

## 2015-10-24 DIAGNOSIS — D242 Benign neoplasm of left breast: Secondary | ICD-10-CM

## 2015-10-24 DIAGNOSIS — Z0000 Encounter for general adult medical examination without abnormal findings: Secondary | ICD-10-CM

## 2015-10-24 DIAGNOSIS — R0789 Other chest pain: Secondary | ICD-10-CM

## 2015-10-24 DIAGNOSIS — R0602 Shortness of breath: Secondary | ICD-10-CM | POA: Insufficient documentation

## 2015-10-24 DIAGNOSIS — R0600 Dyspnea, unspecified: Secondary | ICD-10-CM | POA: Diagnosis not present

## 2015-10-24 DIAGNOSIS — E739 Lactose intolerance, unspecified: Secondary | ICD-10-CM

## 2015-10-24 MED ORDER — LACTASE 3000 UNITS PO TABS: 3000.0000 [IU] | ORAL_TABLET | Freq: Three times a day (TID) | ORAL | Status: DC

## 2015-10-24 MED ORDER — ASPIRIN 81 MG PO TABS: 81.0000 mg | ORAL_TABLET | Freq: Every day | ORAL | Status: DC

## 2015-10-24 NOTE — Assessment & Plan Note (Signed)
Patient has symptoms of lactose intolerance. -trial of lactase with dairy containing meals.

## 2015-10-24 NOTE — Assessment & Plan Note (Signed)
Patient continues to have left breast pain at 6 o'clock position. No palpable mass. Imaging from [place] in 07/2015 was BIRADS 4. Biopsy showed fibrocystic changes (unable to interpret full report as in [place]).  -send for diagnostic mammogram of breast

## 2015-10-24 NOTE — Assessment & Plan Note (Signed)
45 y/o female presents for annual well women/preventative exam. -up to date on immunizations -Up to date on Pap Smear -Diagnostic mammogram ordered for breast pain (see specific problem) -Discussed healthy eating habits

## 2015-10-24 NOTE — Assessment & Plan Note (Signed)
Patient reports chest heaviness that is atypical in nature. EKG showed no ischemic changes. -Echo to evaluate heart structure -referral to Cardiology

## 2015-10-24 NOTE — Progress Notes (Signed)
45 y.o. year old female presents for well woman/preventative visit. [place] video interpretor present.   Acute Concerns: Heart concerns - had test in [place] which showed "leaky valves", patient reports shortness of breath for the past few months that improved with putting pillow up, has had increased sob (at rest and with activity), also had chest heaviness (with shortness of breath). Breast Concerns - continued left breast pain, see below for mammogram results below. No FM of breast cancer.    Diet: typical meal (for breakfast/lunch/dinner) is chicken/rice/vegetable, usually has a few servings of fruit per day, drinks milk very occasionally (has diarrhea with milk)  Exercise: no regular exercise (feels dizzy with walking on treadmill)  Sexual/Birth History: Husband Luanne Bras, one daughter, sexually active with husband (infrequently)  Birth Control: Irregular periods due to IUD (likely Mirena, placed January 2015)  POA/Living Will: No living will  Social:  Social History   Social History  . Marital Status: Married    Spouse Name: N/A  . Number of Children: N/A  . Years of Education: N/A   Social History Main Topics  . Smoking status: Never Smoker   . Smokeless tobacco: Never Used  . Alcohol Use: No  . Drug Use: No  . Sexual Activity: Yes    Birth Control/ Protection: IUD   Other Topics Concern  . None   Social History Narrative   02/05/15   Born in [place]   Lives with daughter and Husband    Immunization: Immunization History  Administered Date(s) Administered  . Influenza-Unspecified 07/07/2015  . Tdap 02/05/2015    Cancer Screening:  Pap Smear: 07/2014 - negative  Mammogram: 08/2014 - US guided breast biopsy (fibroadenoma); had repeat Breast mammogram/biopsy in [place] (Fibrocystic Changes, Birads 4 noted on test) in November 2016  Colonoscopy: Not a candidate, no FH of colon cancer  Dexa: Not a candidate  FH of liver cancer (father in 2006), Multiple  family members with various cancer (throat, liver)  Physical Exam: Filed Vitals:   10/24/15 0827  BP: 96/64  Pulse: 67  Temp: 97.6 F (36.4 C)    VITALS: Reviewed GEN: Pleasant female, NAD HEENT: Normocephalic, PERRL, EOMI, no scleral icterus, bilateral TM pearly grey, nasal septum midline, MMM, uvula midline, no anterior or posterior lymphadenopathy, no thyromegaly CARDIAC:RRR, S1 and S2 present, no murmur, no heaves/thrills RESP: CTAB, normal effort BREAST:Exam performed in the presence of a chaperone. No masses. No nipple discharge. No axillary lymphadenopathy. Tenderness noted at 6 oclock position of left breast (no mass present) ABD: soft, no tenderness, normal bowel sounds GU/GYN:Derferred, no reported issues (completed in [place] in November and normal) EXT: No edema, 2+ radial and DP pulses SKIN: no rash  Reviewed EKG from [place] (07/2015) - NSR, no ischemic changes EKG in office - artifact present, unable to get clearer EKG (muscle spasm in lead 2 over left chest), on screen interpretation shows NSR, no ischemic changes, normal intervals.    ASSESSMENT & PLAN: 45 y.o. female presents for annual well woman/preventative exam. Please see problem specific assessment and plan.   Preventative health care 45 y/o female presents for annual well women/preventative exam. -up to date on immunizations -Up to date on Pap Smear -Diagnostic mammogram ordered for breast pain (see specific problem) -Discussed healthy eating habits  Fibroadenoma of breast Patient continues to have left breast pain at 6 o'clock position. No palpable mass. Imaging from [place] in 07/2015 was BIRADS 4. Biopsy showed fibrocystic changes (unable to interpret full report as in [place]).  -  send for diagnostic mammogram of breast  Dyspnea Patient reports dyspnea at rest and exertion. Echo in [place] 07/2015 showed "leaky valves" per patient. Unable to read/interpret results that patient brought. Cardiac  exam unremarkable today.  -repeat Echo  Atypical chest pain Patient reports chest heaviness that is atypical in nature. EKG showed no ischemic changes. -Echo to evaluate heart structure -referral to Cardiology

## 2015-10-24 NOTE — Assessment & Plan Note (Signed)
Patient reports dyspnea at rest and exertion. Echo in [place] 07/2015 showed "leaky valves" per patient. Unable to read/interpret results that patient brought. Cardiac exam unremarkable today.  -repeat Echo

## 2015-10-24 NOTE — Patient Instructions (Addendum)
It was nice to see you today. I am sorry that you are having shortness of breath.   Breast pain/Abnormal biopsy in [place] - my nurse will call you to schedule a diagnostic mammogram  Chest heaviness/shortness of breath - you are scheduled for an Ultrasound of your heart on 10/28/15 at 1:00 PM, my staff will call you to schedule an appointment with cardiology (heart doctor), Start aspirin 81 mg daily  Milk intolerance - take lactase pills prior to eating milk/dairy products.   Dr. Ree Kida will call you with your results.

## 2015-10-28 ENCOUNTER — Other Ambulatory Visit (HOSPITAL_COMMUNITY): Payer: BLUE CROSS/BLUE SHIELD

## 2015-10-30 ENCOUNTER — Ambulatory Visit
Admission: RE | Admit: 2015-10-30 | Discharge: 2015-10-30 | Disposition: A | Discharge status: Home / Self Care | Payer: BLUE CROSS/BLUE SHIELD | Source: Ambulatory Visit | Attending: Family Medicine | Admitting: Family Medicine

## 2015-10-30 DIAGNOSIS — D242 Benign neoplasm of left breast: Secondary | ICD-10-CM

## 2015-10-30 NOTE — Assessment & Plan Note (Signed)
Diagnostic mammogram/US of left breast showed no mass. BiRADS 1.

## 2015-11-06 ENCOUNTER — Other Ambulatory Visit: Payer: Self-pay

## 2015-11-06 ENCOUNTER — Ambulatory Visit (HOSPITAL_COMMUNITY): Payer: BLUE CROSS/BLUE SHIELD | Attending: Cardiology

## 2015-11-06 DIAGNOSIS — I340 Nonrheumatic mitral (valve) insufficiency: Secondary | ICD-10-CM | POA: Diagnosis not present

## 2015-11-06 DIAGNOSIS — I071 Rheumatic tricuspid insufficiency: Secondary | ICD-10-CM | POA: Insufficient documentation

## 2015-11-06 DIAGNOSIS — R0600 Dyspnea, unspecified: Secondary | ICD-10-CM | POA: Diagnosis present

## 2015-11-06 DIAGNOSIS — I371 Nonrheumatic pulmonary valve insufficiency: Secondary | ICD-10-CM | POA: Insufficient documentation

## 2015-11-06 DIAGNOSIS — D242 Benign neoplasm of left breast: Secondary | ICD-10-CM | POA: Diagnosis not present

## 2015-11-06 DIAGNOSIS — E785 Hyperlipidemia, unspecified: Secondary | ICD-10-CM | POA: Diagnosis not present

## 2015-11-08 ENCOUNTER — Encounter: Payer: Self-pay | Admitting: Family Medicine

## 2015-11-18 ENCOUNTER — Other Ambulatory Visit: Payer: Self-pay

## 2015-11-18 DIAGNOSIS — Z1231 Encounter for screening mammogram for malignant neoplasm of breast: Secondary | ICD-10-CM

## 2015-12-03 ENCOUNTER — Ambulatory Visit (INDEPENDENT_AMBULATORY_CARE_PROVIDER_SITE_OTHER): Payer: BLUE CROSS/BLUE SHIELD | Admitting: Cardiovascular Disease

## 2015-12-03 ENCOUNTER — Encounter: Payer: Self-pay | Admitting: Cardiovascular Disease

## 2015-12-03 VITALS — BP 104/68 | HR 74 | Ht 62.0 in | Wt 128.8 lb

## 2015-12-03 DIAGNOSIS — R0789 Other chest pain: Secondary | ICD-10-CM | POA: Diagnosis not present

## 2015-12-03 NOTE — Progress Notes (Signed)
Cardiology Office Note   Date:  12/03/2015   ID:  Savannah Banks, DOB 02/28/1971, MRN [id number]  PCP:  Lupita Dawn, MD  Cardiologist:   Thayer Headings, MD   Chief Complaint  Patient presents with  . Chest Pain   1. Chest pain    History of Present Illness: Savannah Banks is a 45 y.o. female who presents for evaluation of chest pain  Was seen with Y Ang Hmok ( intrepreter )  Has some chest heaviness during the day .   Not brought on by exertion, eating , drinking  Last for hours - all day at times  Difficult to swallow at times  - does not think that swallowing affects the pain . Pain radiates through to the back  CP is associated with some increased shortness of breath   Had an echo which is essentially normal - mild TR, trivial MR   No past medical history on file.  Past Surgical History  Procedure Laterality Date  . Tonsillectomy       Current Outpatient Prescriptions  Medication Sig Dispense Refill  . aspirin 81 MG tablet Take 1 tablet (81 mg total) by mouth daily. 90 tablet 1  . gabapentin (NEURONTIN) 100 MG capsule Take 1 capsule (100 mg total) by mouth 3 (three) times daily. 90 capsule 1   No current facility-administered medications for this visit.    Allergies:   Shellfish allergy    Social History:  The patient  reports that she has never smoked. She has never used smokeless tobacco. She reports that she does not drink alcohol or use illicit drugs.   Family History:  The patient's family history includes Diabetes in her mother; Hypertension in her mother; Liver cancer in her father.    ROS:  Please see the history of present illness.    Review of Systems: Constitutional:  denies fever, chills, diaphoresis, appetite change and fatigue.  HEENT: denies photophobia, eye pain, redness, hearing loss, ear pain, congestion, sore throat, rhinorrhea, sneezing, neck pain, neck stiffness and tinnitus.  Respiratory: admits to SOB, DOE, cough,   Cardiovascular:  denies chest pain, palpitations and leg swelling.  Gastrointestinal: denies nausea, vomiting, abdominal pain, diarrhea, constipation, blood in stool.  Genitourinary: denies dysuria, urgency, frequency, hematuria, flank pain and difficulty urinating.  Musculoskeletal: denies  myalgias, back pain, joint swelling, arthralgias and gait problem.   Skin: denies pallor, rash and wound.  Neurological: denies dizziness, seizures, syncope, weakness, light-headedness, numbness and headaches.   Hematological: denies adenopathy, easy bruising, personal or family bleeding history.  Psychiatric/ Behavioral: denies suicidal ideation, mood changes, confusion, nervousness, sleep disturbance and agitation.       All other systems are reviewed and negative.    PHYSICAL EXAM: VS:  BP 104/68 mmHg  Pulse 74  Ht 5\' 2"  (1.575 m)  Wt 128 lb 12.8 oz (58.423 kg)  BMI 23.55 kg/m2  SpO2 99% , BMI Body mass index is 23.55 kg/(m^2). GEN: Well nourished, well developed, in no acute distress HEENT: normal Neck: no JVD, carotid bruits, or masses Cardiac: RRR; no murmurs, rubs, or gallops,no edema  Respiratory:  clear to auscultation bilaterally, normal work of breathing GI: soft, nontender, nondistended, + BS MS: no deformity or atrophy Skin: warm and dry, no rash Neuro:  Strength and sensation are intact Psych: normal   EKG:  EKG is not ordered today. The ekg ordered  Feb. 16, 2017 demonstrates NSR at 72.   Normal    Recent  Labs: 02/05/2015: ALT 29; BUN 18; Creat 0.53; Hemoglobin 13.7; Platelets 276; Potassium 4.3; Sodium 140; TSH 1.006    Lipid Panel    Component Value Date/Time   CHOL 208* 02/05/2015 0930   TRIG 71 02/05/2015 0930   HDL 46 02/05/2015 0930   CHOLHDL 4.5 02/05/2015 0930   VLDL 14 02/05/2015 0930   LDLCALC 148* 02/05/2015 0930      Wt Readings from Last 3 Encounters:  12/03/15 128 lb 12.8 oz (58.423 kg)  10/24/15 127 lb (57.607 kg)  07/25/15 129 lb (58.514 kg)      Other  studies Reviewed: Additional studies/ records that were reviewed today include: . Review of the above records demonstrates:    ASSESSMENT AND PLAN:  1.  Chest pain :    Symptoms are atypical but its difficult to assess her symptoms - even with the assistance of the intrrepter.   hwe main concern is that she has cancer.  I've asked her to see her medical doctor about this issue.  Will do a stress myoview  The echo is essentially normal  - normal LV function with mild TR and trivial MR. I will see her on an as needed basis .    Current medicines are reviewed at length with the patient today.  The patient does not have concerns regarding medicines.  The following changes have been made:  no change  Labs/ tests ordered today include:  No orders of the defined types were placed in this encounter.    Disposition:   FU with me as needed    Nahser, Wonda Cheng, MD  12/03/2015 9:00 AM    [address], [place], [place]  [address] Phone: [phone]; Fax: [phone]   [facility]  [address] [place], [place]  [address] [phone]   Fax [phone]

## 2015-12-03 NOTE — Patient Instructions (Addendum)
Medication Instructions:  Your physician does not recommend any new medications at this time.   Labwork: None Ordered   Testing/Procedures: Your physician has requested that you have an exercise stress myoview. For further information please visit [email or web address]. Please follow instruction sheet, as given.    Follow-Up: Your physician recommends that you schedule a follow-up appointment in: as needed with Dr. Acie Fredrickson   If you need a refill on your cardiac medications before your next appointment, please call your pharmacy.   Thank you for choosing [facility]! Christen Bame, RN [phone]

## 2015-12-04 ENCOUNTER — Telehealth (HOSPITAL_COMMUNITY): Payer: Self-pay | Admitting: *Deleted

## 2015-12-04 NOTE — Telephone Encounter (Signed)
Attempted to call regarding upcoming appointment- no answer x 2. Irelynn Schermerhorn J Diogo Anne, RN 

## 2015-12-05 ENCOUNTER — Telehealth (HOSPITAL_COMMUNITY): Payer: Self-pay | Admitting: *Deleted

## 2015-12-05 NOTE — Telephone Encounter (Signed)
Attempted to to call patient- number busy x2. Hubbard Robinson, RN

## 2015-12-09 ENCOUNTER — Ambulatory Visit (HOSPITAL_COMMUNITY): Payer: BLUE CROSS/BLUE SHIELD | Attending: Cardiovascular Disease

## 2015-12-09 DIAGNOSIS — R42 Dizziness and giddiness: Secondary | ICD-10-CM | POA: Diagnosis not present

## 2015-12-09 DIAGNOSIS — R9439 Abnormal result of other cardiovascular function study: Secondary | ICD-10-CM | POA: Diagnosis not present

## 2015-12-09 DIAGNOSIS — R5383 Other fatigue: Secondary | ICD-10-CM | POA: Diagnosis not present

## 2015-12-09 DIAGNOSIS — R0789 Other chest pain: Secondary | ICD-10-CM

## 2015-12-09 DIAGNOSIS — R0602 Shortness of breath: Secondary | ICD-10-CM | POA: Diagnosis not present

## 2015-12-09 LAB — MYOCARDIAL PERFUSION IMAGING
CHL CUP NUCLEAR SSS: 8
Estimated workload: 10.1 METS
Exercise duration (min): 8 min
Exercise duration (sec): 30 s
LV dias vol: 53 mL (ref 46–106)
LV sys vol: 12 mL
MPHR: 177 {beats}/min
NUC STRESS TID: 1.5
Peak HR: 166 {beats}/min
Percent HR: 93 %
RATE: 0.38
Rest HR: 77 {beats}/min
SDS: 2
SRS: 6

## 2015-12-09 MED ORDER — TECHNETIUM TC 99M SESTAMIBI GENERIC - CARDIOLITE
10.2000 | Freq: Once | INTRAVENOUS | Status: AC | PRN
  Administered 2015-12-09: 10 via INTRAVENOUS

## 2015-12-09 MED ORDER — TECHNETIUM TC 99M SESTAMIBI GENERIC - CARDIOLITE
32.8000 | Freq: Once | INTRAVENOUS | Status: AC | PRN
  Administered 2015-12-09: 32.8 via INTRAVENOUS

## 2015-12-30 ENCOUNTER — Ambulatory Visit
Admission: RE | Admit: 2015-12-30 | Discharge: 2015-12-30 | Disposition: A | Discharge status: Home / Self Care | Payer: BLUE CROSS/BLUE SHIELD | Source: Ambulatory Visit

## 2015-12-30 DIAGNOSIS — Z1231 Encounter for screening mammogram for malignant neoplasm of breast: Secondary | ICD-10-CM

## 2016-08-23 ENCOUNTER — Encounter (HOSPITAL_COMMUNITY): Payer: Self-pay | Admitting: Emergency Medicine

## 2016-08-23 ENCOUNTER — Ambulatory Visit (HOSPITAL_COMMUNITY)
Admission: EM | Admit: 2016-08-23 | Discharge: 2016-08-23 | Disposition: A | Discharge status: Home / Self Care | Payer: BLUE CROSS/BLUE SHIELD | Attending: Emergency Medicine | Admitting: Emergency Medicine

## 2016-08-23 DIAGNOSIS — J00 Acute nasopharyngitis [common cold]: Secondary | ICD-10-CM | POA: Diagnosis not present

## 2016-08-23 DIAGNOSIS — R0982 Postnasal drip: Secondary | ICD-10-CM | POA: Diagnosis not present

## 2016-08-23 MED ORDER — HYDROCOD POLST-CPM POLST ER 10-8 MG/5ML PO SUER: 5.0000 mL | Freq: Two times a day (BID) | ORAL | 0 refills | Status: DC | PRN

## 2016-08-23 NOTE — ED Triage Notes (Signed)
Reports a 4 day history of cough, dry cough, itchy throat and headache and chest soreness with cough

## 2016-08-23 NOTE — ED Provider Notes (Signed)
CSN: [id number]     Arrival date & time 08/23/16  1200 History   First MD Initiated Contact with Patient 08/23/16 1218     Chief Complaint  Patient presents with  . URI   (Consider location/radiation/quality/duration/timing/severity/associated sxs/prior Treatment) 45 year old Asian female with broken English is complaining of cough for 4 days. She states her nose feels dry and her throat feels irritated. Denies fever, chills, shortness of breath, chest pain or GI symptoms. She has been taking Delsym DM with without much relief. She shows me a photograph of a bottle of Tussionex that she states her doctor gives her every year.      History reviewed. No pertinent past medical history. Past Surgical History:  Procedure Laterality Date  . TONSILLECTOMY     Family History  Problem Relation Age of Onset  . Diabetes Mother   . Hypertension Mother   . Liver cancer Father    Social History  Substance Use Topics  . Smoking status: Never Smoker  . Smokeless tobacco: Never Used  . Alcohol use No   OB History    Gravida Para Term Preterm AB Living   3 1 1  0 2 1   SAB TAB Ectopic Multiple Live Births   2             Review of Systems  Constitutional: Negative.   HENT: Positive for sore throat. Negative for congestion, ear pain and sinus pain.   Respiratory: Positive for cough. Negative for shortness of breath.   Cardiovascular: Negative for chest pain.  Musculoskeletal: Negative.   All other systems reviewed and are negative.   Allergies  Shellfish allergy  Home Medications   Prior to Admission medications   Medication Sig Start Date End Date Taking? Authorizing Provider  Dextromethorphan-Guaifenesin (TUSSIN DM PO) Take by mouth.   Yes Historical Provider, MD  aspirin 81 MG tablet Take 1 tablet (81 mg total) by mouth daily. 10/24/15   Lupita Dawn, MD  chlorpheniramine-HYDROcodone (TUSSIONEX PENNKINETIC ER) 10-8 MG/5ML SUER Take 5 mLs by mouth every 12 (twelve) hours as  needed for cough. 08/23/16   Janne Napoleon, NP  gabapentin (NEURONTIN) 100 MG capsule Take 1 capsule (100 mg total) by mouth 3 (three) times daily. 07/25/15   Lupita Dawn, MD   Meds Ordered and Administered this Visit  Medications - No data to display  BP 126/76 (BP Location: Right Arm)   Pulse 80   Temp 97.9 F (36.6 C) (Oral)   Resp 16   SpO2 99%  No data found.   Physical Exam  Constitutional: She is oriented to person, place, and time. She appears well-developed and well-nourished. No distress.  HENT:  Right Ear: External ear normal.  Left Ear: External ear normal.  Mouth/Throat: No oropharyngeal exudate.  Oropharynx with much cobblestoning, clear PND and minor erythema.  Lateral TMs are normal.  Eyes: EOM are normal. Pupils are equal, round, and reactive to light.  Neck: Normal range of motion. Neck supple.  Cardiovascular: Normal rate, regular rhythm and normal heart sounds.   Pulmonary/Chest: Effort normal and breath sounds normal. No respiratory distress. She has no wheezes.  Musculoskeletal: Normal range of motion. She exhibits no edema.  Lymphadenopathy:    She has no cervical adenopathy.  Neurological: She is alert and oriented to person, place, and time.  Skin: Skin is warm and dry.  Nursing note and vitals reviewed.   Urgent Care Course   Clinical Course     Procedures (including critical  care time)  Labs Review Labs Reviewed - No data to display  Imaging Review No results found.   Visual Acuity Review  Right Eye Distance:   Left Eye Distance:   Bilateral Distance:    Right Eye Near:   Left Eye Near:    Bilateral Near:         MDM   1. Acute nasopharyngitis   2. PND (post-nasal drip)    Your cough is due to drainage in the back of your throat and your sinuses. By taking antihistamines this will help with your drainage, cough and tickling in the back of your throat. After you complete this prescription medicine he may take Allegra,  Zyrtec or chlorpheniramine 2-4 mg as needed for drainage. Drink plenty fluids and stay well-hydrated. Meds ordered this encounter  Medications  . Dextromethorphan-Guaifenesin (TUSSIN DM PO)    Sig: Take by mouth.  . chlorpheniramine-HYDROcodone (TUSSIONEX PENNKINETIC ER) 10-8 MG/5ML SUER    Sig: Take 5 mLs by mouth every 12 (twelve) hours as needed for cough.    Dispense:  30 mL    Refill:  0    Order Specific Question:   Supervising Provider    Answer:   Melony Overly [id number]       Janne Napoleon, NP 08/23/16 1308

## 2016-08-23 NOTE — Discharge Instructions (Signed)
Your cough is due to drainage in the back of your throat and your sinuses. By taking antihistamines this will help with your drainage, cough and tickling in the back of your throat. After you complete this prescription medicine he may take Allegra, Zyrtec or chlorpheniramine 2-4 mg as needed for drainage. Drink plenty fluids and stay well-hydrated.

## 2016-08-30 ENCOUNTER — Ambulatory Visit (HOSPITAL_COMMUNITY)
Admission: EM | Admit: 2016-08-30 | Discharge: 2016-08-30 | Disposition: A | Discharge status: Home / Self Care | Payer: BLUE CROSS/BLUE SHIELD | Attending: Family Medicine | Admitting: Family Medicine

## 2016-08-30 ENCOUNTER — Encounter (HOSPITAL_COMMUNITY): Payer: Self-pay | Admitting: *Deleted

## 2016-08-30 DIAGNOSIS — J069 Acute upper respiratory infection, unspecified: Secondary | ICD-10-CM

## 2016-08-30 MED ORDER — GUAIFENESIN-CODEINE 100-10 MG/5ML PO SYRP: 10.0000 mL | ORAL_SOLUTION | Freq: Four times a day (QID) | ORAL | 0 refills | Status: DC | PRN

## 2016-08-30 MED ORDER — AZITHROMYCIN 250 MG PO TABS: ORAL_TABLET | ORAL | 0 refills | Status: DC

## 2016-08-30 MED ORDER — IPRATROPIUM BROMIDE 0.06 % NA SOLN: 2.0000 | Freq: Four times a day (QID) | NASAL | 1 refills | Status: DC

## 2016-08-30 NOTE — ED Provider Notes (Signed)
[place]    CSN: [id number] Arrival date & time: 08/30/16  1200     History   Chief Complaint Chief Complaint  Patient presents with  . Cough    HPI Savannah Banks is a 45 y.o. female.   The history is provided by the patient.  Cough  Cough characteristics:  Non-productive and dry Severity:  Moderate Duration:  1 week Progression:  Unchanged Chronicity:  New Smoker: no   Context comment:  Seen 12/17 for uri, sx comtinue with st. Associated symptoms: rhinorrhea and sore throat   Associated symptoms: no fever     History reviewed. No pertinent past medical history.  Patient Active Problem List   Diagnosis Date Noted  . Dyspnea 10/24/2015  . Lactose intolerance 10/24/2015  . Muscle spasm of back 07/25/2015  . Atypical chest pain 07/25/2015  . Hyperlipidemia 03/04/2015  . Numbness of fingers of both hands 03/04/2015  . IUD (intrauterine device) in place 02/11/2015  . Preventative health care 02/05/2015  . Fibroadenoma of breast 02/05/2015  . Dizziness 02/05/2015    Past Surgical History:  Procedure Laterality Date  . TONSILLECTOMY      OB History    Gravida Para Term Preterm AB Living   3 1 1  0 2 1   SAB TAB Ectopic Multiple Live Births   2               Home Medications    Prior to Admission medications   Medication Sig Start Date End Date Taking? Authorizing Provider  aspirin 81 MG tablet Take 1 tablet (81 mg total) by mouth daily. 10/24/15   Lupita Dawn, MD  chlorpheniramine-HYDROcodone (TUSSIONEX PENNKINETIC ER) 10-8 MG/5ML SUER Take 5 mLs by mouth every 12 (twelve) hours as needed for cough. 08/23/16   Janne Napoleon, NP  Dextromethorphan-Guaifenesin (TUSSIN DM PO) Take by mouth.    Historical Provider, MD  gabapentin (NEURONTIN) 100 MG capsule Take 1 capsule (100 mg total) by mouth 3 (three) times daily. 07/25/15   Lupita Dawn, MD    Family History Family History  Problem Relation Age of Onset  . Diabetes Mother   . Hypertension  Mother   . Liver cancer Father     Social History Social History  Substance Use Topics  . Smoking status: Never Smoker  . Smokeless tobacco: Never Used  . Alcohol use No     Allergies   Shellfish allergy   Review of Systems Review of Systems  Constitutional: Negative for fever.  HENT: Positive for congestion, postnasal drip, rhinorrhea and sore throat.   Respiratory: Positive for cough.   Cardiovascular: Negative.   All other systems reviewed and are negative.    Physical Exam Triage Vital Signs ED Triage Vitals [08/30/16 1217]  Enc Vitals Group     BP 111/69     Pulse Rate 83     Resp 14     Temp 98 F (36.7 C)     Temp Source Oral     SpO2 98 %     Weight      Height      Head Circumference      Peak Flow      Pain Score      Pain Loc      Pain Edu?      Excl. in [place]?    No data found.   Updated Vital Signs BP 111/69 (BP Location: Right Arm)   Pulse 83   Temp 98  F (36.7 C) (Oral)   Resp 14   SpO2 98%   Visual Acuity Right Eye Distance:   Left Eye Distance:   Bilateral Distance:    Right Eye Near:   Left Eye Near:    Bilateral Near:     Physical Exam  Constitutional: She appears well-developed and well-nourished. No distress.  HENT:  Right Ear: External ear normal.  Left Ear: External ear normal.  Nose: Nose normal.  Mouth/Throat: Oropharynx is clear and moist.  Eyes: Conjunctivae are normal. Pupils are equal, round, and reactive to light.  Neck: Normal range of motion.  Cardiovascular: Normal rate, regular rhythm, normal heart sounds and intact distal pulses.   Pulmonary/Chest: Effort normal and breath sounds normal.  Lymphadenopathy:    She has no cervical adenopathy.  Skin: Skin is warm and dry.  Nursing note and vitals reviewed.    UC Treatments / Results  Labs (all labs ordered are listed, but only abnormal results are displayed) Labs Reviewed - No data to display  EKG  EKG Interpretation None       Radiology No  results found.  Procedures Procedures (including critical care time)  Medications Ordered in UC Medications - No data to display   Initial Impression / Assessment and Plan / UC Course  I have reviewed the triage vital signs and the nursing notes.  Pertinent labs & imaging results that were available during my care of the patient were reviewed by me and considered in my medical decision making (see chart for details).  Clinical Course       Final Clinical Impressions(s) / UC Diagnoses   Final diagnoses:  None    New Prescriptions New Prescriptions   No medications on file     Billy Fischer, MD 09/15/16 1007

## 2016-08-30 NOTE — ED Triage Notes (Signed)
Seen  Here        1  Week            Throat  Is   Sore  From  Coughing

## 2016-08-30 NOTE — ED Triage Notes (Signed)
Seen  Here  1   Week  Ago  For    Similar  Symptoms     Given    Cough  Medications

## 2016-10-05 NOTE — Progress Notes (Signed)
46 y.o. year old female presents for well woman/preventative visit and annual GYN examination.  Acute Concerns: 1. Left breast pain - mostly at night, history of fibroadenoma, no radiation 2. Skin Lesion - below right eye, pruritic, non painful, present for a few months, has not changed in size, scales occasionally 3. Constipation - BM every 2-3 days, no diarrhea, tolerating diet, does report some dysphagia with sold food, no dysphagia with solids, present for the past 3 months 4. Right low back pain - intermittent,has not taken any otc medications, no radiation down leg, no bladder/bowel incontinence, no fevers, no weakness/numbness of LE  Diet: typical meal (for breakfast/lunch/dinner) is chicken/rice/vegetable, usually has a few servings of fruit per day, drinks milk very occasionally (has diarrhea with milk), no soda  Exercise: No formal exercise  Sexual/Birth History: Husband Luanne Bras, one daughter, sexually active with husband (infrequently)  Birth Control: IUD placed 2015  POA/Living Will: No living will  Social:  Social History   Social History  . Marital status: Married    Spouse name: N/A  . Number of children: N/A  . Years of education: N/A   Social History Main Topics  . Smoking status: Never Smoker  . Smokeless tobacco: Never Used  . Alcohol use No  . Drug use: No  . Sexual activity: Yes    Birth control/ protection: IUD   Other Topics Concern  . None   Social History Narrative   02/05/15   Born in [place]   Lives with daughter and Husband    Immunization: Immunization History  Administered Date(s) Administered  . Influenza-Unspecified 07/07/2015  . Tdap 02/05/2015  Due for flu vaccine.   Cancer Screening:  Pap Smear: 07/2014 (negative)  Mammogram: 12/2015 (BI-RADS 1)  Colonoscopy: Not a candidate, no FH of colon cancer  Dexa: Not a Candidate    Physical Exam: VITALS: Reviewed GEN: Pleasant female, NAD HEENT: Normocephalic, PERRL, EOMI, no  scleral icterus, bilateral TM pearly grey, nasal septum midline, MMM, uvula midline, no anterior or posterior lymphadenopathy, no thyromegaly CARDIAC:RRR, S1 and S2 present, no murmur, no heaves/thrills RESP: CTAB, normal effort BREAST:Exam performed in the presence of a chaperone. Small fibrocystic changes of breasts, no mass, tenderness of left breast/chest wall/rib at 6 o'clock position, tenderness of right breast/chest wall at 5 o'clock position ABD: soft, no tenderness, normal bowel sounds GU/GYN:Deferred EXT: No edema, 2+ radial and DP pulses SKIN: 1 mm by 1 mm papule adjacent to lateral margin of right eyelid. MSK: Lumbar - right paraspinal lumbar tenderness, no midline tenderness or step offs Neuro: strength 5/5 in bilateral LE, sensation to light touch intact, 2+ patellar reflexes bilaterally  ASSESSMENT & PLAN: 46 y.o. female presents for annual well woman/preventative exam and GYN exam. Please see problem specific assessment and plan.   Preventative health care 46 y/o female presents for preventative care. -up to date on immunizations -Up to date on mammogram (repeat April 2018) -Up to date on pap smear (repeat 07/2019) -encouraged healthy eating habits -lab work  Fibroadenoma of breast Bilateral breast pain. Previous workup identified fibroadenoma. No mass on exam today. -encouraged Tylenol prn -apply heat/ice as tolerated to the area  Constipation Trial of Miralax daily  Dysphagia Intermittent dysphagia to solids. -referral to GI for further workup  Lumbar back pain Right paraspinal lumbar tenderness without radicular symptoms. No red flags. Exam unremarkable. -trial of scheduled Tylenol and heat/ice

## 2016-10-08 ENCOUNTER — Encounter: Payer: Self-pay | Admitting: Nurse Practitioner

## 2016-10-08 ENCOUNTER — Other Ambulatory Visit: Payer: Self-pay | Admitting: Family Medicine

## 2016-10-08 ENCOUNTER — Encounter: Payer: Self-pay | Admitting: Family Medicine

## 2016-10-08 ENCOUNTER — Ambulatory Visit (INDEPENDENT_AMBULATORY_CARE_PROVIDER_SITE_OTHER): Payer: BLUE CROSS/BLUE SHIELD | Admitting: Family Medicine

## 2016-10-08 DIAGNOSIS — E785 Hyperlipidemia, unspecified: Secondary | ICD-10-CM | POA: Diagnosis not present

## 2016-10-08 DIAGNOSIS — Z0000 Encounter for general adult medical examination without abnormal findings: Secondary | ICD-10-CM

## 2016-10-08 DIAGNOSIS — K5900 Constipation, unspecified: Secondary | ICD-10-CM

## 2016-10-08 DIAGNOSIS — Z1231 Encounter for screening mammogram for malignant neoplasm of breast: Secondary | ICD-10-CM

## 2016-10-08 DIAGNOSIS — M545 Low back pain, unspecified: Secondary | ICD-10-CM

## 2016-10-08 DIAGNOSIS — R1310 Dysphagia, unspecified: Secondary | ICD-10-CM | POA: Diagnosis not present

## 2016-10-08 DIAGNOSIS — D242 Benign neoplasm of left breast: Secondary | ICD-10-CM

## 2016-10-08 LAB — LIPID PANEL
CHOL/HDL RATIO: 4.1 ratio (ref <5.0)
Cholesterol: 200 mg/dL — ABNORMAL HIGH (ref <200)
HDL: 49 mg/dL — ABNORMAL LOW (ref >50)
LDL CALC: 132 mg/dL — AB (ref <100)
Triglycerides: 96 mg/dL (ref <150)
VLDL: 19 mg/dL (ref <30)

## 2016-10-08 LAB — COMPLETE METABOLIC PANEL WITH GFR
ALT: 47 U/L — AB (ref 6–29)
AST: 30 U/L (ref 10–30)
Albumin: 4.5 g/dL (ref 3.6–5.1)
Alkaline Phosphatase: 71 U/L (ref 33–115)
BILIRUBIN TOTAL: 0.4 mg/dL (ref 0.2–1.2)
BUN: 17 mg/dL (ref 7–25)
CHLORIDE: 104 mmol/L (ref 98–110)
CO2: 24 mmol/L (ref 20–31)
Calcium: 9.1 mg/dL (ref 8.6–10.2)
Creat: 0.67 mg/dL (ref 0.50–1.10)
Glucose, Bld: 84 mg/dL (ref 65–99)
Potassium: 4.1 mmol/L (ref 3.5–5.3)
SODIUM: 140 mmol/L (ref 135–146)
TOTAL PROTEIN: 7.4 g/dL (ref 6.1–8.1)

## 2016-10-08 LAB — CBC
HCT: 41.6 % (ref 35.0–45.0)
Hemoglobin: 13.7 g/dL (ref 11.7–15.5)
MCH: 28.3 pg (ref 27.0–33.0)
MCHC: 32.9 g/dL (ref 32.0–36.0)
MCV: 86 fL (ref 80.0–100.0)
MPV: 9.6 fL (ref 7.5–12.5)
Platelets: 224 10*3/uL (ref 140–400)
RBC: 4.84 MIL/uL (ref 3.80–5.10)
RDW: 12.5 % (ref 11.0–15.0)
WBC: 8.7 10*3/uL (ref 3.8–10.8)

## 2016-10-08 MED ORDER — POLYETHYLENE GLYCOL 3350 17 GM/SCOOP PO POWD: 17.0000 g | Freq: Every day | ORAL | 1 refills | Status: DC

## 2016-10-08 NOTE — Assessment & Plan Note (Signed)
46 y/o female presents for preventative care. -up to date on immunizations -Up to date on mammogram (repeat April 2018) -Up to date on pap smear (repeat 07/2019) -encouraged healthy eating habits -lab work

## 2016-10-08 NOTE — Assessment & Plan Note (Signed)
Trial of Miralax daily

## 2016-10-08 NOTE — Patient Instructions (Signed)
It was nice to see you today.  Dr. Ree Kida will call you with your lab results.  Face Rash - please make an appointment in the [facility] clinic to have this lesion removed.  Constipation - start Miralax once daily, take with 4 ounces of water  Trouble swallowing - I have referred you to a GI doctor, my office will call you to schedule this  Breast Pain/Back Pain - Take Tylenol 325 mg three times per day, also apply heat or ice to the affected areas  You will be due for your mammogram in April, Please call [facility] imaging to schedule this.

## 2016-10-08 NOTE — Assessment & Plan Note (Signed)
Bilateral breast pain. Previous workup identified fibroadenoma. No mass on exam today. -encouraged Tylenol prn -apply heat/ice as tolerated to the area

## 2016-10-08 NOTE — Assessment & Plan Note (Signed)
Intermittent dysphagia to solids. -referral to GI for further workup

## 2016-10-08 NOTE — Assessment & Plan Note (Signed)
Right paraspinal lumbar tenderness without radicular symptoms. No red flags. Exam unremarkable. -trial of scheduled Tylenol and heat/ice

## 2016-10-12 ENCOUNTER — Encounter: Payer: Self-pay | Admitting: Family Medicine

## 2016-10-12 ENCOUNTER — Other Ambulatory Visit: Payer: Self-pay | Admitting: Family Medicine

## 2016-10-12 DIAGNOSIS — R7401 Elevation of levels of liver transaminase levels: Secondary | ICD-10-CM

## 2016-10-12 DIAGNOSIS — E785 Hyperlipidemia, unspecified: Secondary | ICD-10-CM

## 2016-10-12 DIAGNOSIS — R740 Nonspecific elevation of levels of transaminase and lactic acid dehydrogenase [LDH]: Secondary | ICD-10-CM

## 2016-10-12 NOTE — Assessment & Plan Note (Signed)
Recheck in 2 weeks 

## 2016-10-18 ENCOUNTER — Encounter (HOSPITAL_COMMUNITY): Payer: Self-pay | Admitting: Emergency Medicine

## 2016-10-18 ENCOUNTER — Ambulatory Visit (HOSPITAL_COMMUNITY)
Admission: EM | Admit: 2016-10-18 | Discharge: 2016-10-18 | Disposition: A | Discharge status: Home / Self Care | Payer: BLUE CROSS/BLUE SHIELD | Attending: Internal Medicine | Admitting: Internal Medicine

## 2016-10-18 DIAGNOSIS — J029 Acute pharyngitis, unspecified: Secondary | ICD-10-CM | POA: Diagnosis not present

## 2016-10-18 DIAGNOSIS — R6889 Other general symptoms and signs: Secondary | ICD-10-CM | POA: Diagnosis not present

## 2016-10-18 LAB — POCT RAPID STREP A: STREPTOCOCCUS, GROUP A SCREEN (DIRECT): NEGATIVE

## 2016-10-18 MED ORDER — OSELTAMIVIR PHOSPHATE 75 MG PO CAPS: 75.0000 mg | ORAL_CAPSULE | Freq: Two times a day (BID) | ORAL | 0 refills | Status: DC

## 2016-10-18 NOTE — ED Provider Notes (Signed)
CSN: [id number]     Arrival date & time 10/18/16  1501 History   None    Chief Complaint  Patient presents with  . Cough   (Consider location/radiation/quality/duration/timing/severity/associated sxs/prior Treatment)  HPI   The patient is a 46 year old female presenting today with complaints of a sore throat generalized malaise, headache, and body aches with fever for the past several days. Patient reports that she had a cold several months ago but is "sick again". Patient states, "my whole body hurts". Denies abdominal pain, nausea vomiting or diarrhea. Reports nonproductive cough and anterior chest wall pain with coughing.   History reviewed. No pertinent past medical history. Past Surgical History:  Procedure Laterality Date  . TONSILLECTOMY     Family History  Problem Relation Age of Onset  . Diabetes Mother   . Hypertension Mother   . Liver cancer Father    Social History  Substance Use Topics  . Smoking status: Never Smoker  . Smokeless tobacco: Never Used  . Alcohol use No   OB History    Gravida Para Term Preterm AB Living   3 1 1  0 2 1   SAB TAB Ectopic Multiple Live Births   2             Review of Systems  Constitutional: Positive for chills, fatigue and fever.  HENT: Positive for congestion and sore throat.   Eyes: Negative.  Negative for visual disturbance.  Respiratory: Positive for cough. Negative for choking, shortness of breath and wheezing.   Cardiovascular: Positive for chest pain. Negative for leg swelling.       Anterior chest wall pain is reproducible with coughing.  Gastrointestinal: Negative.  Negative for nausea and vomiting.  Endocrine: Negative.   Genitourinary: Negative.   Musculoskeletal: Positive for myalgias. Negative for gait problem, neck pain and neck stiffness.  Skin: Negative.  Negative for color change and rash.  Allergic/Immunologic: Negative.   Neurological: Positive for headaches. Negative for dizziness and weakness.   Hematological: Negative.   Psychiatric/Behavioral: Negative.     Allergies  Shellfish allergy  Home Medications   Prior to Admission medications   Medication Sig Start Date End Date Taking? Authorizing Provider  oseltamivir (TAMIFLU) 75 MG capsule Take 1 capsule (75 mg total) by mouth every 12 (twelve) hours. 10/18/16   Nehemiah Settle, NP   Meds Ordered and Administered this Visit  Medications - No data to display  BP 99/60 (BP Location: Right Arm)   Pulse 89   Temp 98.4 F (36.9 C) (Oral)   Resp 16   SpO2 98%  No data found.   Physical Exam  Constitutional: She appears well-developed and well-nourished. No distress.  HENT:  Head: Normocephalic and atraumatic.  Posterior oropharynx beefy red in appearance with mucoid discharge present. No evidence of exudate or patches.  Eyes: Conjunctivae are normal. Pupils are equal, round, and reactive to light. Right eye exhibits no discharge. Left eye exhibits no discharge. No scleral icterus.  Neck: Normal range of motion. Neck supple.  Negative for nuchal rigidity  Cardiovascular: Normal rate, regular rhythm, normal heart sounds and intact distal pulses.  Exam reveals no gallop and no friction rub.   No murmur heard. Pulmonary/Chest: Effort normal and breath sounds normal. No respiratory distress.  Musculoskeletal: She exhibits no edema.  Lymphadenopathy:    She has no cervical adenopathy.  Neurological: She is alert.  Skin: Skin is warm and dry.  Psychiatric: She has a normal mood and affect.  Nursing note  and vitals reviewed.   Urgent Care Course     Procedures (including critical care time)  Labs Review Labs Reviewed  POCT RAPID STREP A   Results for orders placed or performed during the hospital encounter of 10/18/16  POCT rapid strep A [facility])  Result Value Ref Range   Streptococcus, Group A Screen (Direct) NEGATIVE NEGATIVE    Imaging Review No results found.    MDM   1. Flu-like symptoms    2. Pharyngitis, unspecified etiology    Meds ordered this encounter  Medications  . oseltamivir (TAMIFLU) 75 MG capsule    Sig: Take 1 capsule (75 mg total) by mouth every 12 (twelve) hours.    Dispense:  10 capsule    Refill:  0   The usual and customary discharge instructions and warnings were given.  The patient verbalizes understanding and agrees to plan of care.       Nehemiah Settle, NP 10/18/16 [phone]

## 2016-10-18 NOTE — Discharge Instructions (Signed)
Salt water gargle daily to help with sore throat.  Tylenol for pain.  Take tamiflu to help with symptoms.

## 2016-10-18 NOTE — ED Triage Notes (Signed)
The patient presented to the [facility] with a complaint of a cough x 2 days.

## 2016-10-20 ENCOUNTER — Encounter: Payer: Self-pay | Admitting: Nurse Practitioner

## 2016-10-20 ENCOUNTER — Ambulatory Visit (INDEPENDENT_AMBULATORY_CARE_PROVIDER_SITE_OTHER): Payer: BLUE CROSS/BLUE SHIELD | Admitting: Nurse Practitioner

## 2016-10-20 VITALS — BP 90/60 | HR 81 | Ht 62.0 in | Wt 128.0 lb

## 2016-10-20 DIAGNOSIS — K219 Gastro-esophageal reflux disease without esophagitis: Secondary | ICD-10-CM

## 2016-10-20 DIAGNOSIS — R1310 Dysphagia, unspecified: Secondary | ICD-10-CM | POA: Diagnosis not present

## 2016-10-20 DIAGNOSIS — R1319 Other dysphagia: Secondary | ICD-10-CM

## 2016-10-20 MED ORDER — OMEPRAZOLE 20 MG PO CPDR: DELAYED_RELEASE_CAPSULE | ORAL | 3 refills | Status: DC

## 2016-10-20 NOTE — Patient Instructions (Addendum)
We sent a prescription to [facility].   You have been scheduled for a Barium Esophogram at [facility] (1st floor of the hospital) on 11-04-2016 Wednesday at 9:30 am. Please arrive at 9:15 AM to your appointment for registration. Make certain not to have anything to eat or drink 6 hours prior to your test. If you need to reschedule for any reason, please contact radiology at [phone] to do so. __________________________________________________________________ A barium swallow is an examination that concentrates on views of the esophagus. This tends to be a double contrast exam (barium and two liquids which, when combined, create a gas to distend the wall of the oesophagus) or single contrast (non-ionic iodine based). The study is usually tailored to your symptoms so a good history is essential. Attention is paid during the study to the form, structure and configuration of the esophagus, looking for functional disorders (such as aspiration, dysphagia, achalasia, motility and reflux) EXAMINATION You may be asked to change into a gown, depending on the type of swallow being performed. A radiologist and radiographer will perform the procedure. The radiologist will advise you of the type of contrast selected for your procedure and direct you during the exam. You will be asked to stand, sit or lie in several different positions and to hold a small amount of fluid in your mouth before being asked to swallow while the imaging is performed .In some instances you may be asked to swallow barium coated marshmallows to assess the motility of a solid food bolus. The exam can be recorded as a digital or video fluoroscopy procedure. POST PROCEDURE It will take 1-2 days for the barium to pass through your system. To facilitate this, it is important, unless otherwise directed, to increase your fluids for the next 24-48hrs and to resume your normal diet.  This test typically takes  about 30 minutes to perform. __________________________________________________________________________________

## 2016-10-20 NOTE — Progress Notes (Signed)
      HPI:  Patient is a 46 year old non-English speaking female referred by PCP, Dr. Dossie Arbour for evaluation of dysphagia Interpreter present for exam. Patient gives a two-month history of solid food dysphagia, especially to dry foods. She feels like her throat is hot, burning, and swollen. She was seen at an urgent care center 2 days ago with sore throat,  general malaise, and headache.. She was given Tamiflu for these flu like symptoms . Labs were not done that day but some were done on 10/08/16 including chem profile and CBC which were both normal. Patient has no known history of GERD. Her weight is stable no other GI complaints. She uses MiraLAX as needed for constipation   History reviewed. No pertinent past medical history.   Past Surgical History:  Procedure Laterality Date  . TONSILLECTOMY     Family History  Problem Relation Age of Onset  . Diabetes Mother   . Hypertension Mother   . Liver cancer Father    Social History  Substance Use Topics  . Smoking status: Never Smoker  . Smokeless tobacco: Never Used  . Alcohol use No   Current Outpatient Prescriptions  Medication Sig Dispense Refill  . polyethylene glycol powder (GLYCOLAX/MIRALAX) powder Take 1 Container by mouth once.     No current facility-administered medications for this visit.    Allergies  Allergen Reactions  . Shellfish Allergy Rash    Review of Systems: Positive for cough, headaches, shortness breath, and sore throat.  All other systems reviewed and negative except where noted in HPI.    Physical Exam: BP 90/60   Pulse 81   Ht 5\' 2"  (1.575 m)   Wt 128 lb (58.1 kg)   BMI 23.41 kg/m  Constitutional:  Well-developed, Asian female in no acute distress. Psychiatric: Normal mood and affect. Behavior is normal. EENT: Pupil equal. Conjunctivae are normal. No scleral icterus. No oral lesions Neck supple.  Cardiovascular: Normal rate, regular rhythm.  Pulmonary/chest: Effort normal and breath  sounds normal. No wheezing, rales or rhonchi. Abdominal: Soft, nondistended, nontender. Bowel sounds active throughout. There are no masses palpable. No hepatomegaly. Extremities: no edema Lymphadenopathy: No cervical adenopathy noted. Neurological: Alert and oriented to person place and time. Skin: Skin is warm and dry. No rashes noted.   ASSESSMENT AND PLAN:  46 year old non-English speaking female here for her intermittent solid food dysphagia. No associated weight loss. Patient complains of concurrent sensation of burning and heat in her throat. Seen a few days ago at urgent care with flulike symptoms and pharyngitis, now on Tamiflu.  A little difficult to sort this out with her explanation of symptoms as well as language barrier. Sounds like she could have reflux with the burning in her throat. Esophageal stricture not excluded -Start omeprazole 20 mg every a.m. -To help sort this out will arrange for a barium swallow with tablet. She may or may not require an EGD depending on response to PPI  / barium swallow results.   Tye Savoy, NP  10/20/2016, 8:58 AM  Cc: Lupita Dawn, MD

## 2016-10-21 LAB — CULTURE, GROUP A STREP (THRC)

## 2016-10-21 NOTE — Progress Notes (Signed)
Agree with assessment and plan as outlined. Will await barium study and course on PPI, but will plan on EGD if symptoms persist.

## 2016-11-04 ENCOUNTER — Ambulatory Visit (INDEPENDENT_AMBULATORY_CARE_PROVIDER_SITE_OTHER): Payer: BLUE CROSS/BLUE SHIELD | Admitting: Family Medicine

## 2016-11-04 ENCOUNTER — Ambulatory Visit (HOSPITAL_COMMUNITY)
Admission: RE | Admit: 2016-11-04 | Discharge: 2016-11-04 | Disposition: A | Discharge status: Home / Self Care | Payer: BLUE CROSS/BLUE SHIELD | Source: Ambulatory Visit | Attending: Nurse Practitioner | Admitting: Nurse Practitioner

## 2016-11-04 VITALS — BP 102/60 | HR 80 | Temp 98.8°F | Ht 62.0 in | Wt 128.0 lb

## 2016-11-04 DIAGNOSIS — K219 Gastro-esophageal reflux disease without esophagitis: Secondary | ICD-10-CM | POA: Insufficient documentation

## 2016-11-04 DIAGNOSIS — D489 Neoplasm of uncertain behavior, unspecified: Secondary | ICD-10-CM | POA: Diagnosis not present

## 2016-11-04 NOTE — Patient Instructions (Signed)
It was nice to meet you today! You were seen in clinic for removal of the skin tag near your right eye.  The procedure went well.  You can expect a small amount of bleeding from the site which is normal. Please keep the area dry to let it heal over the next 48 hours.  After that you can wash the skin .  We have sent the specimen to the lab for pathology and will follow up with you with the results.   If you have any questions, please call our clinic.    Lovenia Kim, MD

## 2016-11-04 NOTE — Progress Notes (Signed)
   Subjective:   Patient ID: Savannah Banks    DOB: [birth year], 46 y.o. female   MRN: [id number]  CC: lesion near right eye   HPI: Savannah Banks is a 46 y.o. female who presents to clinic today for a lesion near right eye which she would like removed. She reports it has been there for a few months and is itchy.  Has another similar one under left eye but it is not itchy and does not bother her.    ROS: Denies fevers, chills, rash, nausea, vomiting.   Smoking status reviewed.  Patient is a never smoker.   Medications reviewed.  Objective:   BP 102/60   Pulse 80   Temp 98.8 F (37.1 C) (Oral)   Ht 5\' 2"  (1.575 m)   Wt 128 lb (58.1 kg)   LMP  (LMP Unknown)   BMI 23.41 kg/m  Vitals and nursing note reviewed.  General: well nourished, well developed, in no acute distress HEENT: NCAT, MMM Neck: supple CV: RRR no MRG  Lungs:clear bilaterally  Abdomen: soft, non-tender, +bs Skin: small 2-3 mm raised, non-tender lesion with regular borders noted lateral to right eye Extremities: warm and well perfused, normal tone  Procedure note Patient here for removal of skin lesion near right eye. Verbal and written consent obtained.  The area was cleaned and prepped with alcohol and betadine solution.  1% lidocaine with epinephrine was instilled under the lesion to create a wheal.  Pickups and a 10-blade scalpel were used to excise the lesion.  The specimen was sent to pathology.  Hemostasis obtained using silver nitrate and a small bandage applied over incision site.  No complications and patient tolerated procedure well.  Care instructions were provided.    Assessment & Plan:   Neoplasm of uncertain behavior Patient with lesion near right eye which is bothersome and itchy.  Desires removal today.  Shave biopsy performed and patient tolerated procedure well.  -Specimen sent to derm pathology as uncertain if malignant.  -Will follow up with pathology results   Lovenia Kim, MD [place], PGY-1 11/04/2016 10:33 PM

## 2016-11-04 NOTE — Assessment & Plan Note (Addendum)
Patient with lesion near right eye which is bothersome and itchy.  Desires removal today.  Shave biopsy performed and patient tolerated procedure well.  -Specimen sent to derm pathology as uncertain if malignant.  -Will follow up with pathology results

## 2016-11-06 NOTE — Progress Notes (Signed)
FMTS Attending Note  I personally saw and evaluated the patient. The plan of care was discussed with the resident team. I agree with the assessment and plan as documented by the resident.   Jaice Lague MD 

## 2016-11-10 ENCOUNTER — Encounter: Payer: Self-pay | Admitting: Family Medicine

## 2016-11-24 ENCOUNTER — Other Ambulatory Visit: Payer: BLUE CROSS/BLUE SHIELD

## 2016-11-24 DIAGNOSIS — R740 Nonspecific elevation of levels of transaminase and lactic acid dehydrogenase [LDH]: Principal | ICD-10-CM

## 2016-11-24 DIAGNOSIS — R7401 Elevation of levels of liver transaminase levels: Secondary | ICD-10-CM

## 2016-11-25 LAB — COMPREHENSIVE METABOLIC PANEL
ALBUMIN: 4.3 g/dL (ref 3.5–5.5)
ALT: 49 IU/L — AB (ref 0–32)
AST: 43 IU/L — ABNORMAL HIGH (ref 0–40)
Albumin/Globulin Ratio: 1.8 (ref 1.2–2.2)
Alkaline Phosphatase: 78 IU/L (ref 39–117)
BUN / CREAT RATIO: 31 — AB (ref 9–23)
BUN: 18 mg/dL (ref 6–24)
Bilirubin Total: 0.2 mg/dL (ref 0.0–1.2)
CO2: 24 mmol/L (ref 18–29)
CREATININE: 0.58 mg/dL (ref 0.57–1.00)
Calcium: 8.8 mg/dL (ref 8.7–10.2)
Chloride: 102 mmol/L (ref 96–106)
GFR calc non Af Amer: 112 mL/min/{1.73_m2} (ref >59)
GFR, EST AFRICAN AMERICAN: 130 mL/min/{1.73_m2} (ref >59)
GLUCOSE: 88 mg/dL (ref 65–99)
Globulin, Total: 2.4 g/dL (ref 1.5–4.5)
Potassium: 4.2 mmol/L (ref 3.5–5.2)
Sodium: 139 mmol/L (ref 134–144)
TOTAL PROTEIN: 6.7 g/dL (ref 6.0–8.5)

## 2016-11-26 ENCOUNTER — Encounter: Payer: Self-pay | Admitting: Family Medicine

## 2016-11-26 DIAGNOSIS — R7401 Elevation of levels of liver transaminase levels: Secondary | ICD-10-CM

## 2016-11-26 DIAGNOSIS — R740 Nonspecific elevation of levels of transaminase and lactic acid dehydrogenase [LDH]: Principal | ICD-10-CM

## 2016-11-26 NOTE — Progress Notes (Signed)
Note to RN staff - I have ordered some labs and [place] to evaluate the patient for elevated liver enzymes (I have sent a letter to the patient to inform her). Please schedule a lab appointment and RUQ ultrasounds. Thanks.

## 2016-12-02 LAB — HM PAP SMEAR

## 2016-12-07 ENCOUNTER — Telehealth: Payer: Self-pay | Admitting: *Deleted

## 2016-12-07 NOTE — Telephone Encounter (Signed)
Pt's sister wanted to know that status of pts ultrasound.  If possible she would like to have the appt info when she comes for her lab visit tomorrow. Fleeger, Salome Spotted, CMA

## 2016-12-07 NOTE — Telephone Encounter (Signed)
Appointment scheduled for 4/5 at 7:15am (patient to be NPO after midnight). Will give reminder card at lab appointment tomm.

## 2016-12-08 ENCOUNTER — Other Ambulatory Visit: Payer: BLUE CROSS/BLUE SHIELD

## 2016-12-08 DIAGNOSIS — R740 Nonspecific elevation of levels of transaminase and lactic acid dehydrogenase [LDH]: Principal | ICD-10-CM

## 2016-12-08 DIAGNOSIS — R7401 Elevation of levels of liver transaminase levels: Secondary | ICD-10-CM

## 2016-12-09 LAB — HEPATITIS PANEL, ACUTE
HEP B C IGM: NEGATIVE
HEP B S AG: NEGATIVE
Hep A IgM: NEGATIVE
Hep C Virus Ab: <0.1 s/co ratio (ref 0.0–0.9)

## 2016-12-09 LAB — FERRITIN: Ferritin: 306 ng/mL — ABNORMAL HIGH (ref 15–150)

## 2016-12-10 ENCOUNTER — Ambulatory Visit (HOSPITAL_COMMUNITY)
Admission: RE | Admit: 2016-12-10 | Discharge: 2016-12-10 | Disposition: A | Discharge status: Home / Self Care | Payer: BLUE CROSS/BLUE SHIELD | Source: Ambulatory Visit | Attending: Family Medicine | Admitting: Family Medicine

## 2016-12-10 DIAGNOSIS — R740 Nonspecific elevation of levels of transaminase and lactic acid dehydrogenase [LDH]: Secondary | ICD-10-CM | POA: Diagnosis not present

## 2016-12-10 DIAGNOSIS — R7401 Elevation of levels of liver transaminase levels: Secondary | ICD-10-CM

## 2016-12-11 ENCOUNTER — Encounter: Payer: Self-pay | Admitting: Family Medicine

## 2016-12-11 DIAGNOSIS — R740 Nonspecific elevation of levels of transaminase and lactic acid dehydrogenase [LDH]: Principal | ICD-10-CM

## 2016-12-11 DIAGNOSIS — R7401 Elevation of levels of liver transaminase levels: Secondary | ICD-10-CM

## 2016-12-11 NOTE — Assessment & Plan Note (Signed)
RUQ [place] negative, Acute hepatitis panel negative. Ferritin elevated.  -check iron studies and hemochromatosis genotype

## 2016-12-16 ENCOUNTER — Other Ambulatory Visit: Payer: Self-pay | Admitting: Family Medicine

## 2016-12-16 DIAGNOSIS — R740 Nonspecific elevation of levels of transaminase and lactic acid dehydrogenase [LDH]: Principal | ICD-10-CM

## 2016-12-16 DIAGNOSIS — R7401 Elevation of levels of liver transaminase levels: Secondary | ICD-10-CM

## 2016-12-16 NOTE — Addendum Note (Signed)
Addended by: Lupita Dawn on: 12/16/2016 04:57 PM   Modules accepted: Orders

## 2016-12-30 ENCOUNTER — Ambulatory Visit
Admission: RE | Admit: 2016-12-30 | Discharge: 2016-12-30 | Disposition: A | Discharge status: Home / Self Care | Payer: BLUE CROSS/BLUE SHIELD | Source: Ambulatory Visit | Attending: Family Medicine | Admitting: Family Medicine

## 2016-12-30 ENCOUNTER — Ambulatory Visit: Payer: BLUE CROSS/BLUE SHIELD

## 2016-12-30 DIAGNOSIS — Z1231 Encounter for screening mammogram for malignant neoplasm of breast: Secondary | ICD-10-CM

## 2017-01-05 ENCOUNTER — Other Ambulatory Visit: Payer: BLUE CROSS/BLUE SHIELD

## 2017-01-05 DIAGNOSIS — R740 Nonspecific elevation of levels of transaminase and lactic acid dehydrogenase [LDH]: Principal | ICD-10-CM

## 2017-01-05 DIAGNOSIS — R7401 Elevation of levels of liver transaminase levels: Secondary | ICD-10-CM

## 2017-01-09 LAB — IRON AND TIBC
IRON SATURATION: 36 % (ref 15–55)
Iron: 117 ug/dL (ref 27–159)
Total Iron Binding Capacity: 329 ug/dL (ref 250–450)
UIBC: 212 ug/dL (ref 131–425)

## 2017-01-09 LAB — HEMOCHROMATOSIS DNA-PCR(C282Y,H63D)

## 2017-01-16 NOTE — Progress Notes (Signed)
First thing I would do would be to repeat the LFT's - she had some sort of URI illness in Feb so that might have been cause If she has persistently elevated LFT's I would do ANA, anti-smooth muscle antibodies and mitochondrial antibodies  I doubt she has iron overload issues and would not do a liver bx right now  Let me know if any other ?   Glendell Docker

## 2017-01-19 ENCOUNTER — Telehealth: Payer: Self-pay | Admitting: Family Medicine

## 2017-01-19 NOTE — Telephone Encounter (Signed)
Attempted to call patient with [place] interpretor to discuss Hemochromatosis labs, no answer, left message that I would call again at a later time.

## 2017-01-21 NOTE — Telephone Encounter (Signed)
Attempted to call patient using interpretor. No answer. Left message that I would call again at last time.

## 2017-01-27 ENCOUNTER — Encounter: Payer: Self-pay | Admitting: Family Medicine

## 2017-01-27 ENCOUNTER — Other Ambulatory Visit: Payer: Self-pay | Admitting: Family Medicine

## 2017-01-27 DIAGNOSIS — R740 Nonspecific elevation of levels of transaminase and lactic acid dehydrogenase [LDH]: Principal | ICD-10-CM

## 2017-01-27 DIAGNOSIS — Z148 Genetic carrier of other disease: Secondary | ICD-10-CM | POA: Insufficient documentation

## 2017-01-27 DIAGNOSIS — R7401 Elevation of levels of liver transaminase levels: Secondary | ICD-10-CM

## 2017-01-27 NOTE — Progress Notes (Signed)
RN staff - please call patient and schedule lab appointment, I need to repeat her LFT's, orders placed.

## 2017-02-15 ENCOUNTER — Other Ambulatory Visit: Payer: BLUE CROSS/BLUE SHIELD

## 2017-02-15 DIAGNOSIS — R7401 Elevation of levels of liver transaminase levels: Secondary | ICD-10-CM

## 2017-02-15 DIAGNOSIS — R740 Nonspecific elevation of levels of transaminase and lactic acid dehydrogenase [LDH]: Principal | ICD-10-CM

## 2017-02-16 ENCOUNTER — Encounter: Payer: Self-pay | Admitting: Family Medicine

## 2017-02-16 DIAGNOSIS — R740 Nonspecific elevation of levels of transaminase and lactic acid dehydrogenase [LDH]: Principal | ICD-10-CM

## 2017-02-16 DIAGNOSIS — R7401 Elevation of levels of liver transaminase levels: Secondary | ICD-10-CM

## 2017-02-16 LAB — CMP14+EGFR
ALT: 21 IU/L (ref 0–32)
AST: 21 IU/L (ref 0–40)
Albumin/Globulin Ratio: 1.7 (ref 1.2–2.2)
Albumin: 4.3 g/dL (ref 3.5–5.5)
Alkaline Phosphatase: 77 IU/L (ref 39–117)
BUN/Creatinine Ratio: 28 — ABNORMAL HIGH (ref 9–23)
BUN: 17 mg/dL (ref 6–24)
Bilirubin Total: 0.5 mg/dL (ref 0.0–1.2)
CO2: 21 mmol/L (ref 20–29)
CREATININE: 0.6 mg/dL (ref 0.57–1.00)
Calcium: 8.7 mg/dL (ref 8.7–10.2)
Chloride: 104 mmol/L (ref 96–106)
GFR calc Af Amer: 128 mL/min/{1.73_m2} (ref >59)
GFR, EST NON AFRICAN AMERICAN: 111 mL/min/{1.73_m2} (ref >59)
GLOBULIN, TOTAL: 2.5 g/dL (ref 1.5–4.5)
Glucose: 80 mg/dL (ref 65–99)
Potassium: 4.1 mmol/L (ref 3.5–5.2)
SODIUM: 139 mmol/L (ref 134–144)
Total Protein: 6.8 g/dL (ref 6.0–8.5)

## 2017-02-16 NOTE — Assessment & Plan Note (Signed)
Resolved on recheck 

## 2017-02-21 ENCOUNTER — Ambulatory Visit (HOSPITAL_COMMUNITY)
Admission: EM | Admit: 2017-02-21 | Discharge: 2017-02-21 | Disposition: A | Discharge status: Home / Self Care | Payer: BLUE CROSS/BLUE SHIELD | Attending: Internal Medicine | Admitting: Internal Medicine

## 2017-02-21 ENCOUNTER — Encounter (HOSPITAL_COMMUNITY): Payer: Self-pay | Admitting: Emergency Medicine

## 2017-02-21 DIAGNOSIS — J029 Acute pharyngitis, unspecified: Secondary | ICD-10-CM

## 2017-02-21 DIAGNOSIS — J343 Hypertrophy of nasal turbinates: Secondary | ICD-10-CM

## 2017-02-21 MED ORDER — CETIRIZINE HCL 10 MG PO CAPS: 10.0000 mg | ORAL_CAPSULE | Freq: Every day | ORAL | 0 refills | Status: DC

## 2017-02-21 MED ORDER — LIDOCAINE VISCOUS 2 % MT SOLN: 5.0000 mL | OROMUCOSAL | 0 refills | Status: DC | PRN

## 2017-02-21 NOTE — Discharge Instructions (Signed)
Do not swallow the lidocaine.

## 2017-02-21 NOTE — ED Provider Notes (Signed)
CSN: [id number]     Arrival date & time 02/21/17  1534 History   None    Chief Complaint  Patient presents with  . Sore Throat   (Consider location/radiation/quality/duration/timing/severity/associated sxs/prior Treatment) HPI  Here for sore throat that started last night. Assoicates mild cough and HA. Feels that she is getting worse. She is status post tonsillectomy.  She has tried tylenol and this did not help.  She feels that she is getting worse.   History reviewed. No pertinent past medical history. Past Surgical History:  Procedure Laterality Date  . BREAST BIOPSY    . TONSILLECTOMY     Family History  Problem Relation Age of Onset  . Diabetes Mother   . Hypertension Mother   . Liver cancer Father    Social History  Substance Use Topics  . Smoking status: Never Smoker  . Smokeless tobacco: Never Used  . Alcohol use No   OB History    Gravida Para Term Preterm AB Living   3 1 1  0 2 1   SAB TAB Ectopic Multiple Live Births   2             Review of Systems  Constitutional: Negative for activity change.  HENT: Negative for congestion, sinus pain and sinus pressure.   Eyes: Positive for itching.  Respiratory: Negative for cough, choking and shortness of breath.   Cardiovascular: Negative for chest pain and leg swelling.  Gastrointestinal: Negative for nausea.  Neurological: Negative for dizziness.    Allergies  Shellfish allergy  Home Medications   Prior to Admission medications   Medication Sig Start Date End Date Taking? Authorizing Provider  Cetirizine HCl (ZYRTEC ALLERGY) 10 MG CAPS Take 1 capsule (10 mg total) by mouth daily. 02/21/17   Tereasa Coop, PA-C  lidocaine (XYLOCAINE) 2 % solution Use as directed 5-10 mLs in the mouth or throat as needed for mouth pain. 02/21/17   Tereasa Coop, PA-C  omeprazole (PRILOSEC) 20 MG capsule Take 1 tab every morning. 10/20/16   Willia Craze, NP  polyethylene glycol powder (GLYCOLAX/MIRALAX) powder Take 1  Container by mouth once.    [provider]   Meds Ordered and Administered this Visit  Medications - No data to display  BP 104/70 (BP Location: Left Arm)   Pulse 71   Temp 98.1 F (36.7 C) (Oral)   Resp 16   SpO2 99%  No data found.   Physical Exam  Constitutional: She appears well-developed and well-nourished. No distress.  HENT:  Head: Normocephalic.  Right Ear: External ear normal.  Left Ear: External ear normal.  Mouth/Throat: Uvula is midline and mucous membranes are normal. Posterior oropharyngeal erythema present. No oropharyngeal exudate, posterior oropharyngeal edema or tonsillar abscesses. Tonsils are 0 on the right. Tonsils are 0 on the left.  Cardiovascular: Normal rate.   No murmur heard. Pulmonary/Chest: Effort normal and breath sounds normal. No respiratory distress. She has no wheezes. She has no rales. She exhibits no tenderness.  Skin: She is not diaphoretic.    Urgent Care Course     Procedures (including critical care time)  Labs Review Labs Reviewed - No data to display  Imaging Review No results found.   Visual Acuity Review  Right Eye Distance:   Left Eye Distance:   Bilateral Distance:    Right Eye Near:   Left Eye Near:    Bilateral Near:         MDM   1. Sore throat  2. Nasal turbinate hypertrophy    No alarm features.  Nasal turbinate hypertrophy suggest an allergic cause. Will treat pain and treat allergies with zyrtec. RTC here as needed.     Tereasa Coop, PA-C 02/21/17 1714

## 2017-02-21 NOTE — ED Triage Notes (Signed)
Pt c/o sore/burnign of throat onset last night... Attributes discomfort to red hot peppers she ate  Seen before here w/similar sx.   Sx also include odynphagia and unable to eat well  A&O x4... NAD... Ambulatory

## 2017-02-22 LAB — POCT RAPID STREP A: STREPTOCOCCUS, GROUP A SCREEN (DIRECT): NEGATIVE

## 2017-02-24 LAB — CULTURE, GROUP A STREP (THRC)

## 2017-07-18 ENCOUNTER — Encounter (HOSPITAL_COMMUNITY): Payer: Self-pay | Admitting: Family Medicine

## 2017-07-18 ENCOUNTER — Ambulatory Visit (HOSPITAL_COMMUNITY)
Admission: EM | Admit: 2017-07-18 | Discharge: 2017-07-18 | Disposition: A | Discharge status: Home / Self Care | Payer: BLUE CROSS/BLUE SHIELD | Attending: Urgent Care | Admitting: Urgent Care

## 2017-07-18 DIAGNOSIS — J029 Acute pharyngitis, unspecified: Secondary | ICD-10-CM

## 2017-07-18 DIAGNOSIS — B9789 Other viral agents as the cause of diseases classified elsewhere: Secondary | ICD-10-CM

## 2017-07-18 DIAGNOSIS — J069 Acute upper respiratory infection, unspecified: Secondary | ICD-10-CM | POA: Diagnosis not present

## 2017-07-18 MED ORDER — PSEUDOEPHEDRINE HCL ER 120 MG PO TB12: 120.0000 mg | ORAL_TABLET | Freq: Two times a day (BID) | ORAL | 3 refills | Status: DC

## 2017-07-18 MED ORDER — BENZONATATE 100 MG PO CAPS: 100.0000 mg | ORAL_CAPSULE | Freq: Three times a day (TID) | ORAL | 0 refills | Status: DC | PRN

## 2017-07-18 MED ORDER — HYDROCODONE-HOMATROPINE 5-1.5 MG/5ML PO SYRP: 5.0000 mL | ORAL_SOLUTION | Freq: Every evening | ORAL | 0 refills | Status: DC | PRN

## 2017-07-18 NOTE — Discharge Instructions (Signed)
For sore throat try using a honey-based tea. Use 3 teaspoons of honey with juice squeezed from half lemon. Place shaved pieces of ginger into 1/2-1 cup of water and warm over stove top. Then mix the ingredients and repeat every 4 hours as needed.  Do not use Flonase until you get better as it can make your current respiratory infection worse.

## 2017-07-18 NOTE — ED Triage Notes (Signed)
Pt here for cough x 1 week with fever. Taking OTC meds. Flonase, robitussin and mucinex.

## 2017-07-18 NOTE — ED Provider Notes (Signed)
    MRN: [id number] DOB: [birth year]  Subjective:   Savannah Banks is a 46 y.o. female presenting for chief complaint of Cough  Reports 1 week history of dry cough that elicits chest pain, shob, head pain. Also has sore and scratchy throat, subjective fever. Denies smoking cigarettes.  No current facility-administered medications for this encounter.    Current Outpatient Medications  Medication Sig Dispense Refill  . Cetirizine HCl (ZYRTEC ALLERGY) 10 MG CAPS Take 1 capsule (10 mg total) by mouth daily. 30 capsule 0  . lidocaine (XYLOCAINE) 2 % solution Use as directed 5-10 mLs in the mouth or throat as needed for mouth pain. 100 mL 0  . omeprazole (PRILOSEC) 20 MG capsule Take 1 tab every morning. 90 capsule 3  . polyethylene glycol powder (GLYCOLAX/MIRALAX) powder Take 1 Container by mouth once.      Savannah Banks is allergic to shellfish allergy.  Savannah Banks  has no past medical history on file. Also  has a past surgical history that includes Tonsillectomy and Breast biopsy.  Objective:   Vitals: BP 109/76   Pulse 90   Temp 98.3 F (36.8 C)   Resp 18   SpO2 98%   Physical Exam  Constitutional: She is oriented to person, place, and time. She appears well-developed and well-nourished.  HENT:  Nasal turbinates erythematous, nasal passages patent. No sinus tenderness. Throat with significant post-nasal drainage and erythema but no exudates, mucous membranes moist.  Cardiovascular: Normal rate, regular rhythm and intact distal pulses. Exam reveals no gallop and no friction rub.  No murmur heard. Pulmonary/Chest: No respiratory distress. She has no wheezes. She has no rales.  Neurological: She is alert and oriented to person, place, and time.   Assessment and Plan :   Viral URI with cough  Sore throat  Likely viral in nature, advised supportive care. Cough suppression medications given. Return-to-clinic precautions discussed, patient verbalized understanding.   Jaynee Eagles, PA-C [facility]  07/18/2017  12:17 PM    Jaynee Eagles, PA-C 07/18/17 1229

## 2017-07-25 ENCOUNTER — Ambulatory Visit (HOSPITAL_COMMUNITY)
Admission: EM | Admit: 2017-07-25 | Discharge: 2017-07-25 | Disposition: A | Discharge status: Home / Self Care | Payer: BLUE CROSS/BLUE SHIELD | Attending: Family Medicine | Admitting: Family Medicine

## 2017-07-25 ENCOUNTER — Encounter (HOSPITAL_COMMUNITY): Payer: Self-pay | Admitting: *Deleted

## 2017-07-25 ENCOUNTER — Other Ambulatory Visit: Payer: Self-pay

## 2017-07-25 DIAGNOSIS — J40 Bronchitis, not specified as acute or chronic: Secondary | ICD-10-CM | POA: Diagnosis not present

## 2017-07-25 MED ORDER — AZITHROMYCIN 250 MG PO TABS: 250.0000 mg | ORAL_TABLET | Freq: Every day | ORAL | 0 refills | Status: DC

## 2017-07-25 MED ORDER — PREDNISONE 20 MG PO TABS: ORAL_TABLET | ORAL | 0 refills | Status: DC

## 2017-07-25 NOTE — ED Provider Notes (Signed)
[place]   [id number] 07/25/17 Arrival Time: 24   SUBJECTIVE:  Savannah Banks is a 46 y.o. female who presents to the urgent care with complaint of  cough x 2 wks.  Was seen in [facility] 11/11 & given Rxs for Tessalon, Sudafed, and Hycodan.  States no relief with the meds.  Denies fevers.  C/O head pain with coughing and pruritic throat.  Cough is violent, particularly at night.  No h/o asthma.  Patient works at [employer]     History reviewed. No pertinent past medical history. Family History  Problem Relation Age of Onset  . Diabetes Mother   . Hypertension Mother   . Liver cancer Father    Social History   Socioeconomic History  . Marital status: Married    Spouse name: Not on file  . Number of children: Not on file  . Years of education: Not on file  . Highest education level: Not on file  Social Needs  . Financial resource strain: Not on file  . Food insecurity - worry: Not on file  . Food insecurity - inability: Not on file  . Transportation needs - medical: Not on file  . Transportation needs - non-medical: Not on file  Occupational History  . Not on file  Tobacco Use  . Smoking status: Never Smoker  . Smokeless tobacco: Never Used  Substance and Sexual Activity  . Alcohol use: No  . Drug use: No  . Sexual activity: Not on file  Other Topics Concern  . Not on file  Social History Narrative   02/05/15   Born in [place]   Lives with daughter and Husband   Current Meds  Medication Sig  . pseudoephedrine (SUDAFED 12 HOUR) 120 MG 12 hr tablet Take 1 tablet (120 mg total) 2 (two) times daily by mouth.  . [DISCONTINUED] benzonatate (TESSALON) 100 MG capsule Take 1-2 capsules (100-200 mg total) 3 (three) times daily as needed by mouth for cough.  . [DISCONTINUED] HYDROcodone-homatropine (HYCODAN) 5-1.5 MG/5ML syrup Take 5 mLs at bedtime as needed by mouth for cough.   Allergies  Allergen Reactions  . Shellfish Allergy Rash      ROS: As per HPI,  remainder of ROS negative.   OBJECTIVE:   Vitals:   07/25/17 1208  BP: 110/74  Pulse: 79  Resp: 18  Temp: 98.1 F (36.7 C)  TempSrc: Oral  SpO2: 97%     General appearance: alert; no distress Eyes: PERRL; EOMI; conjunctiva normal HENT: normocephalic; atraumatic; external ears normal without trauma; nasal mucosa normal; oral mucosa normal Neck: supple Lungs: clear to auscultation bilaterally Heart: regular rate and rhythm Back: no CVA tenderness Extremities: no cyanosis or edema; symmetrical with no gross deformities Skin: warm and dry Neurologic: normal gait; grossly normal Psychological: alert and cooperative; normal mood and affect      Labs:  Results for orders placed or performed during the hospital encounter of 02/21/17  POCT rapid strep A [facility])  Result Value Ref Range   Streptococcus, Group A Screen (Direct) NEGATIVE NEGATIVE    Labs Reviewed - No data to display  No results found.     ASSESSMENT & PLAN:  1. Bronchitis     Meds ordered this encounter  Medications  . azithromycin (ZITHROMAX) 250 MG tablet    Sig: Take 1 tablet (250 mg total) daily by mouth. Take first 2 tablets together, then 1 every day until finished.    Dispense:  6 tablet  Refill:  0  . predniSONE (DELTASONE) 20 MG tablet    Sig: Two daily with food    Dispense:  10 tablet    Refill:  0    Reviewed expectations re: course of current medical issues. Questions answered. Outlined signs and symptoms indicating need for more acute intervention. Patient verbalized understanding. After Visit Summary given.    Procedures:      Robyn Haber, MD 07/25/17 1219

## 2017-07-25 NOTE — ED Triage Notes (Addendum)
Pt reports cough x 2 wks.  Was seen in [facility] 11/11 & given Rxs for Tessalon, Sudafed, and Hycodan.  States no relief with the meds.  Denies fevers.  C/O head pain with coughing and pruritic throat.

## 2017-09-05 IMAGING — US US ABDOMEN LIMITED
1 series · 14 of 25 positions shown · non-contrast
Comparison: None.

CLINICAL DATA: Elevated liver enzymes

EXAM:
US ABDOMEN LIMITED - RIGHT UPPER QUADRANT

[Series 1: us abdomen limited · 0.19mm/px · 14 of 26 slices shown]
[im 1/26]
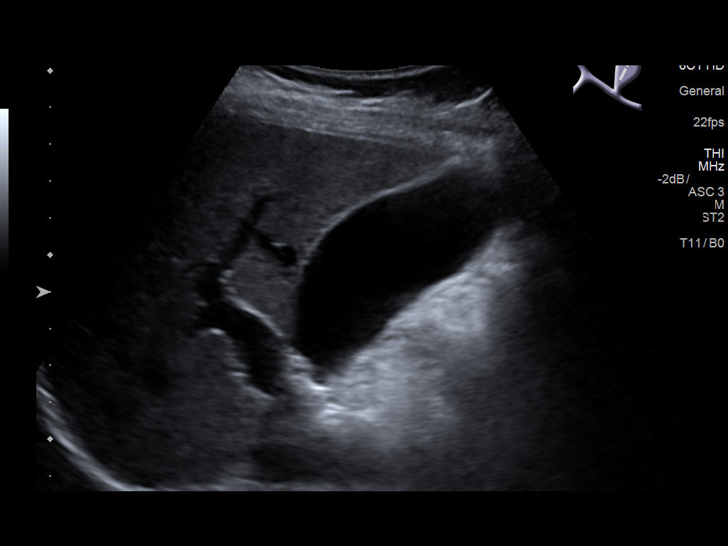
[im 3/26]
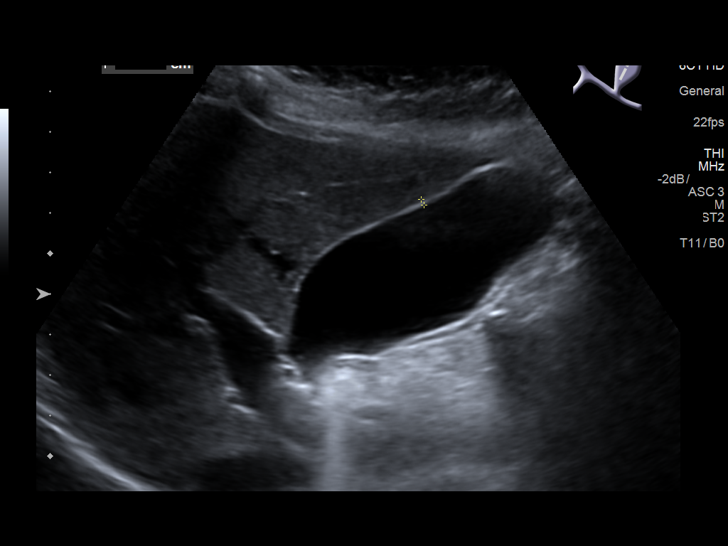
[im 5/26]
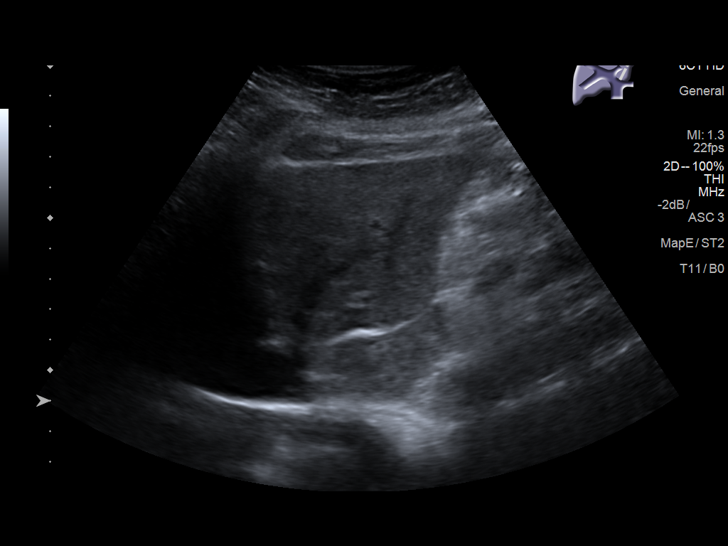
[im 7/26]
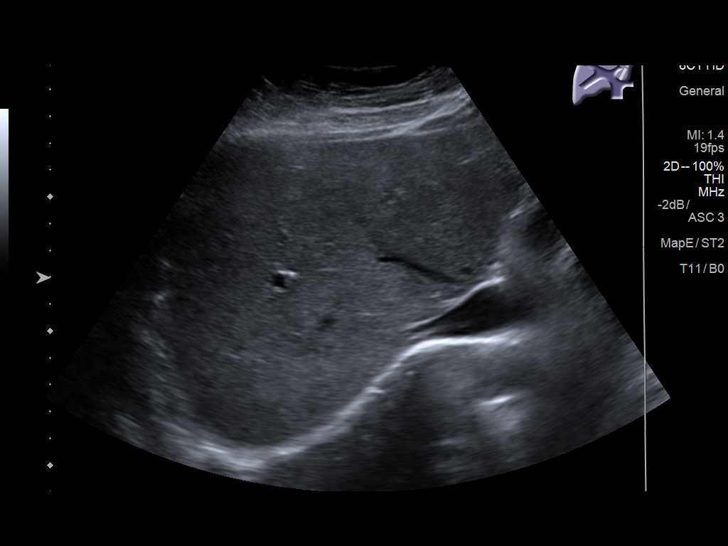
[im 9/26]
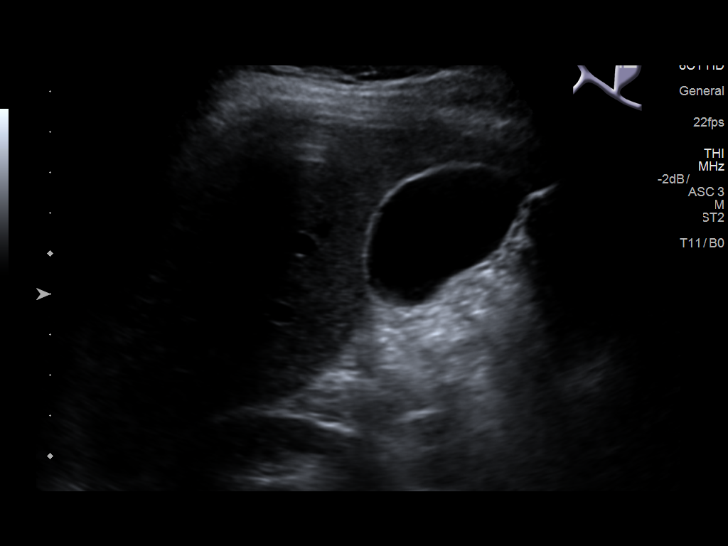
[im 10/26]
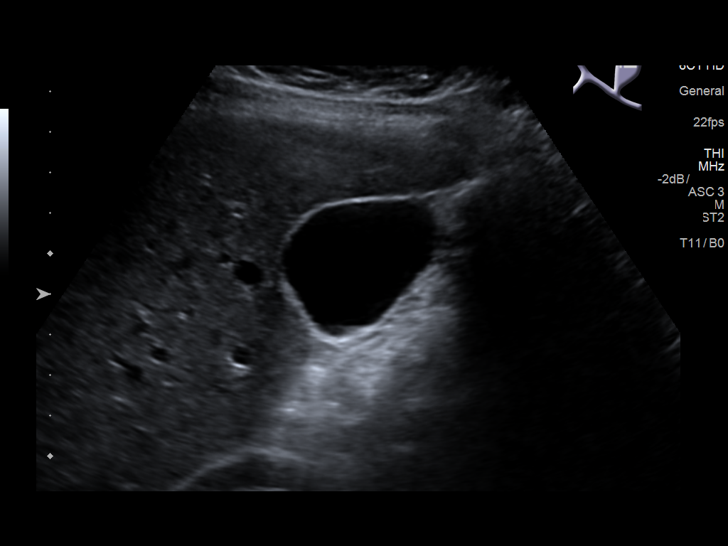
[im 12/26]
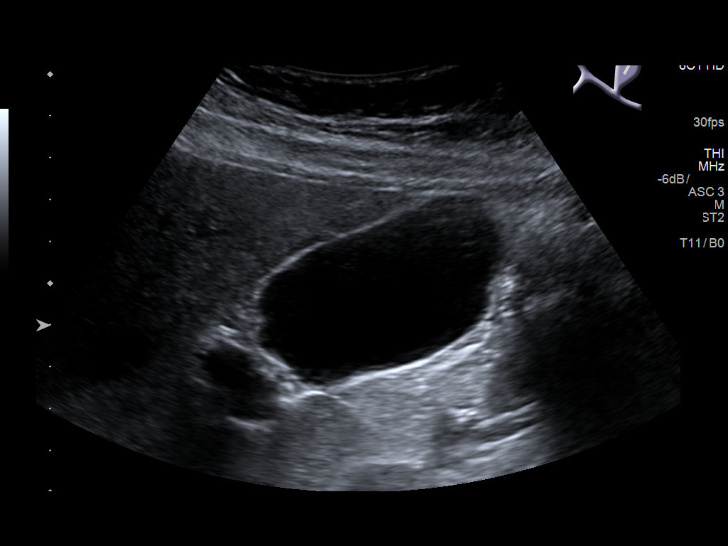
[im 14/26]
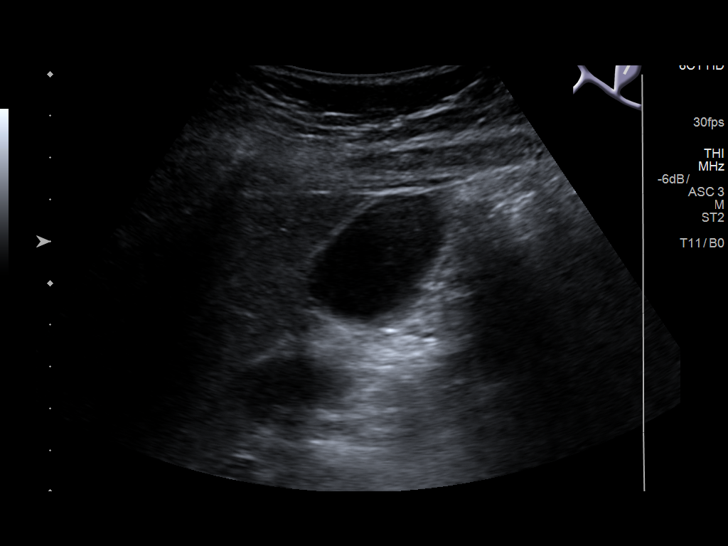
[im 16/26]
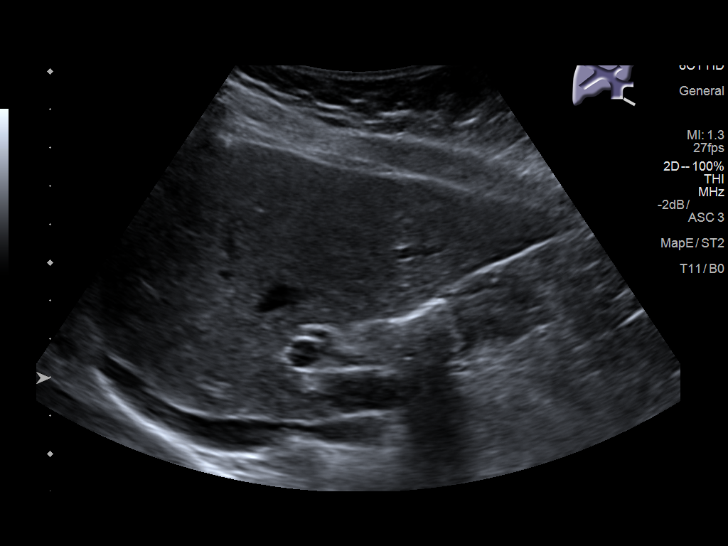
[im 17/26]
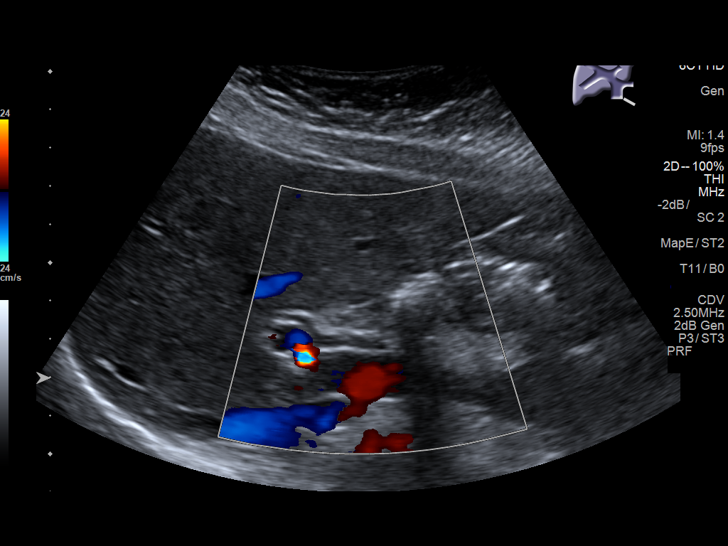
[im 19/26]
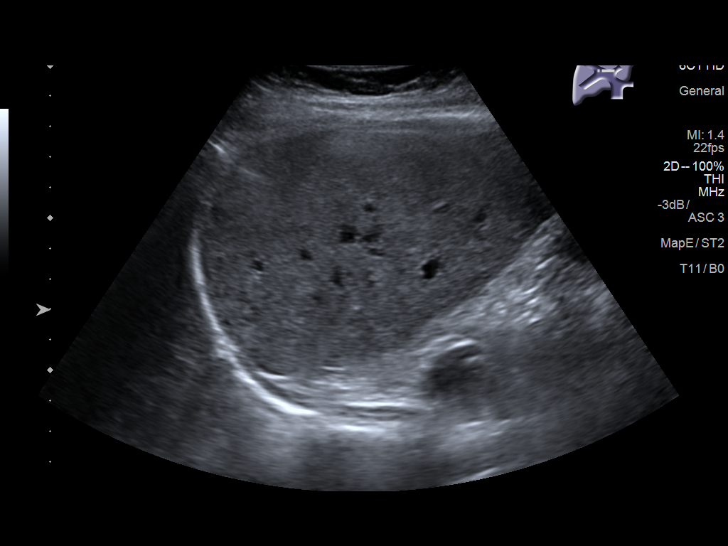
[im 21/26]
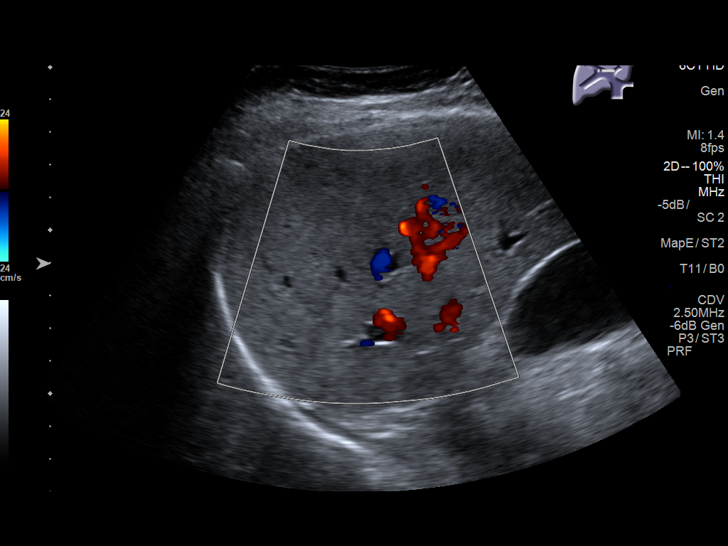
[im 23/26]
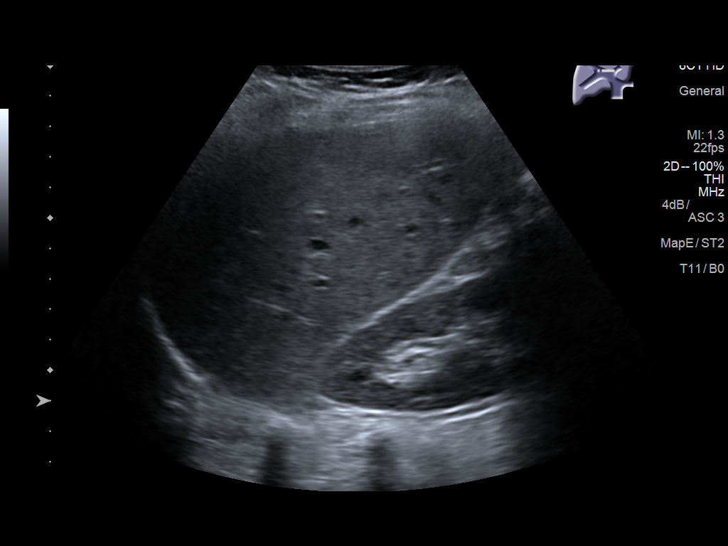
[im 26/26]
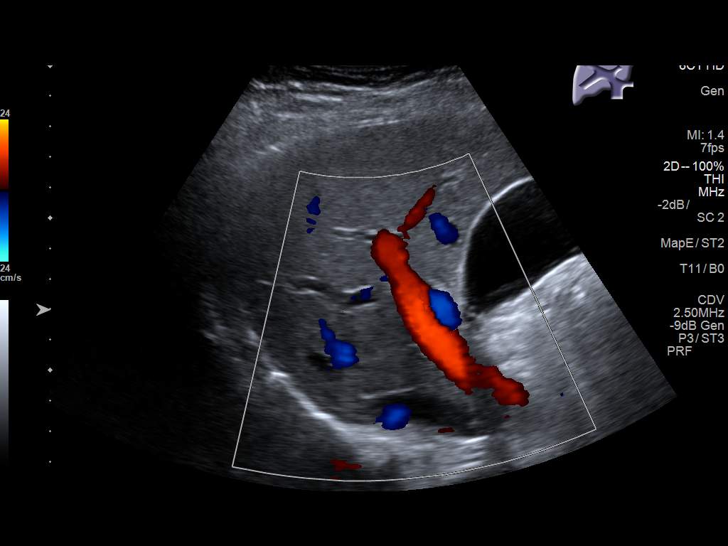

[14 of 25 positions shown; findings below may reference images not displayed]

FINDINGS: Gallbladder:

No gallstones or wall thickening visualized. No sonographic [place]
sign noted by sonographer.

Common bile duct:

Diameter: 2.5 mm

Liver:

No focal lesion identified. Within normal limits in parenchymal
echogenicity.
IMPRESSION: Normal right upper quadrant ultrasound.

## 2017-09-21 ENCOUNTER — Other Ambulatory Visit: Payer: Self-pay | Admitting: Gynecology

## 2017-09-21 DIAGNOSIS — Z1231 Encounter for screening mammogram for malignant neoplasm of breast: Secondary | ICD-10-CM

## 2017-10-10 ENCOUNTER — Ambulatory Visit (HOSPITAL_COMMUNITY)
Admission: EM | Admit: 2017-10-10 | Discharge: 2017-10-10 | Disposition: A | Discharge status: Home / Self Care | Payer: BLUE CROSS/BLUE SHIELD | Attending: Urgent Care | Admitting: Urgent Care

## 2017-10-10 ENCOUNTER — Other Ambulatory Visit: Payer: Self-pay

## 2017-10-10 ENCOUNTER — Encounter (HOSPITAL_COMMUNITY): Payer: Self-pay | Admitting: *Deleted

## 2017-10-10 DIAGNOSIS — B9789 Other viral agents as the cause of diseases classified elsewhere: Secondary | ICD-10-CM | POA: Diagnosis not present

## 2017-10-10 DIAGNOSIS — R070 Pain in throat: Secondary | ICD-10-CM

## 2017-10-10 DIAGNOSIS — J069 Acute upper respiratory infection, unspecified: Secondary | ICD-10-CM | POA: Diagnosis not present

## 2017-10-10 MED ORDER — HYDROCODONE-HOMATROPINE 5-1.5 MG/5ML PO SYRP: 5.0000 mL | ORAL_SOLUTION | Freq: Every evening | ORAL | 0 refills | Status: DC | PRN

## 2017-10-10 MED ORDER — PSEUDOEPHEDRINE HCL ER 120 MG PO TB12: 120.0000 mg | ORAL_TABLET | Freq: Two times a day (BID) | ORAL | 1 refills | Status: DC

## 2017-10-10 MED ORDER — BENZONATATE 100 MG PO CAPS: 100.0000 mg | ORAL_CAPSULE | Freq: Three times a day (TID) | ORAL | 0 refills | Status: DC | PRN

## 2017-10-10 NOTE — Discharge Instructions (Signed)
For sore throat try using a honey-based tea. Use 3 teaspoons of honey with juice squeezed from half lemon. Place shaved pieces of ginger into 1/2-1 cup of water and warm over stove top. Then mix the ingredients and repeat every 4 hours as needed.   Drink 2 liters of water daily to thin out the mucus in your throat.

## 2017-10-10 NOTE — ED Triage Notes (Addendum)
Via video interpreter: C/O cough and body aches x 2 days without fever.  Also c/o "tickling" throat causing cough.

## 2017-10-10 NOTE — ED Provider Notes (Signed)
  MRN: [id number] DOB: 11/01/1970  Subjective:   Savannah Banks is a 47 y.o. female presenting for 2 day history of body aches, headaches, dry cough, scratchy and sore throat. Has tried APAP with minimal relief. Denies sinus pain, ear pain, ear drainage, fever, chest pain, shob, wheezing, n/v, abdominal pain. Denies smoking cigarettes. Denies history of asthma.  Savannah Banks is not currently taking any medications and is allergic to shellfish allergy.  Savannah Banks denies past medical history and  has a past surgical history that includes Tonsillectomy and Breast biopsy.  Objective:   Vitals: BP 113/75   Pulse 79   Temp (!) 97.3 F (36.3 C) (Oral)   Resp 16   SpO2 96%   Physical Exam  Constitutional: She is oriented to person, place, and time. She appears well-developed and well-nourished.  HENT:  TM's intact bilaterally, no effusions or erythema. Nasal turbinates pink, moist, nasal passages patent. No sinus tenderness. Oropharynx with thick post-nasal drainage, mucous membranes moist.  Eyes: Right eye exhibits no discharge. Left eye exhibits no discharge.  Neck: Normal range of motion. Neck supple.  Cardiovascular: Normal rate, regular rhythm and intact distal pulses. Exam reveals no gallop and no friction rub.  No murmur heard. Pulmonary/Chest: No respiratory distress. She has no wheezes. She has no rales.  Lymphadenopathy:    She has no cervical adenopathy.  Neurological: She is alert and oriented to person, place, and time.  Skin: Skin is warm and dry.   Assessment and Plan :   Viral URI with cough  Throat pain  Will start supportive care for viral type illness. Counseled patient on potential for adverse effects with medications prescribed today, patient verbalized understanding. Return-to-clinic precautions discussed, patient verbalized understanding.    Jaynee Eagles, PA-C 10/10/17 1230

## 2018-01-03 ENCOUNTER — Ambulatory Visit
Admission: RE | Admit: 2018-01-03 | Discharge: 2018-01-03 | Disposition: A | Discharge status: Home / Self Care | Payer: BLUE CROSS/BLUE SHIELD | Source: Ambulatory Visit | Attending: Gynecology | Admitting: Gynecology

## 2018-01-03 DIAGNOSIS — Z1231 Encounter for screening mammogram for malignant neoplasm of breast: Secondary | ICD-10-CM

## 2018-02-02 ENCOUNTER — Other Ambulatory Visit: Payer: Self-pay | Admitting: Nurse Practitioner

## 2018-03-08 ENCOUNTER — Other Ambulatory Visit: Payer: Self-pay

## 2018-03-08 ENCOUNTER — Ambulatory Visit (INDEPENDENT_AMBULATORY_CARE_PROVIDER_SITE_OTHER): Payer: BLUE CROSS/BLUE SHIELD | Admitting: Family Medicine

## 2018-03-08 ENCOUNTER — Other Ambulatory Visit: Payer: Self-pay | Admitting: Family Medicine

## 2018-03-08 ENCOUNTER — Encounter: Payer: Self-pay | Admitting: Family Medicine

## 2018-03-08 VITALS — BP 92/64 | HR 66 | Temp 98.3°F | Ht 62.0 in | Wt 124.0 lb

## 2018-03-08 DIAGNOSIS — Z13220 Encounter for screening for lipoid disorders: Secondary | ICD-10-CM | POA: Diagnosis not present

## 2018-03-08 DIAGNOSIS — R202 Paresthesia of skin: Secondary | ICD-10-CM

## 2018-03-08 DIAGNOSIS — N644 Mastodynia: Secondary | ICD-10-CM

## 2018-03-08 DIAGNOSIS — R42 Dizziness and giddiness: Secondary | ICD-10-CM

## 2018-03-08 DIAGNOSIS — R740 Nonspecific elevation of levels of transaminase and lactic acid dehydrogenase [LDH]: Secondary | ICD-10-CM

## 2018-03-08 DIAGNOSIS — Z0001 Encounter for general adult medical examination with abnormal findings: Secondary | ICD-10-CM | POA: Diagnosis not present

## 2018-03-08 DIAGNOSIS — K219 Gastro-esophageal reflux disease without esophagitis: Secondary | ICD-10-CM | POA: Diagnosis not present

## 2018-03-08 DIAGNOSIS — R7401 Elevation of levels of liver transaminase levels: Secondary | ICD-10-CM

## 2018-03-08 DIAGNOSIS — Z0000 Encounter for general adult medical examination without abnormal findings: Secondary | ICD-10-CM

## 2018-03-08 DIAGNOSIS — E785 Hyperlipidemia, unspecified: Secondary | ICD-10-CM

## 2018-03-08 LAB — POCT GLYCOSYLATED HEMOGLOBIN (HGB A1C): Hemoglobin A1C: 5.6 % (ref 4.0–5.6)

## 2018-03-08 NOTE — Progress Notes (Signed)
Date of Visit: 03/08/2018   HPI:  Patient presents today for a well woman exam. [place] interpreter utilized during this encounter. History difficult because when I asked questions through the interpreter patient often responded with a completely unrelated answer even when questions were asked multiple times.  Concerns today: see below Periods: minimal periods, has IUD Contraception: IUD Pelvic symptoms: denies vaginal discharge or pelvic pain Sexual activity: sexually active with one female partner, husband STD Screening: declines testing for STDs today Pap smear status: current on pap Exercise: no Diet: tries to eat healthy Smoking: no Alcohol: no Drugs: no Mood: good Dentist: goes to dentist occasionally  Issues to discuss today: L breast pain for 7 years since delivery of her last child. No nipple discharge or bleeding. She is very worried she has breast cancer. Had routine screening mammogram on 4/29. Had prior biopsy of left breast lesion in December 2015 which was benign fibroadenoma. Patient thinks she has a lump in L breast.  Tingling sensation hands and toes. Started 1 week ago.   Occasional lightheadedness. Every 2-3 days. No recent syncope, has been years since that happened.  GERD - takes omeprazole 20mg  daily as needed. Doing well on this medication.   ROS: See HPI  [place]:   PHYSICAL EXAM: BP 92/64   Pulse 66   Temp 98.3 F (36.8 C) (Oral)   Ht 5\' 2"  (1.575 m)   Wt 124 lb (56.2 kg)   SpO2 99%   BMI 22.68 kg/m  Gen: NAD, pleasant, cooperative HEENT: NCAT, PERRL, no palpable thyromegaly or anterior cervical lymphadenopathy Heart: RRR, no murmurs Lungs: CTAB, NWOB Abdomen: soft, nontender to palpation Neuro: grossly nonfocal, speech normal Breasts: bilateral breasts normal in appearance. No erythema, deformity, or nipple discharge. No palpable abnormal masses (even when patient gestures to area of mass that she feels). No axillary lymphadenopathy.   Extremities: full strength bilateral upper &lower extremities. Sensation intact to light touch on bilateral lower & upper extremities.  ASSESSMENT/PLAN:  Health maintenance:  -STD screening: declines today -pap smear: current -mammogram: getting diagnostic mammogram of L breast since patient believes she feels a different mass -lipid screening: check today -immunizations: current -handout given on health maintenance topics  Dizziness Lightheadedness occasionally. Orthostatics negative during this visit. Check CBC along with labs. Blood pressure runs low chronically so suspect this may be the culprit.  Elevated transaminase level Recheck LFTs today  Hyperlipidemia Fasting today, check lipids  GERD (gastroesophageal reflux disease) Well controlled. Continue current regimen.    Numbness/tingling of hands & feet Check A1c, B12 with labs.   [place]. Ardelia Mems, [place]

## 2018-03-08 NOTE — Patient Instructions (Signed)
Checking labs today Will call or send letter with results Ordered diagnostic mammogram  Be well, Dr. Ardelia Mems    Health Maintenance, Female Adopting a healthy lifestyle and getting preventive care can go a long way to promote health and wellness. Talk with your health care provider about what schedule of regular examinations is right for you. This is a good chance for you to check in with your provider about disease prevention and staying healthy. In between checkups, there are plenty of things you can do on your own. Experts have done a lot of research about which lifestyle changes and preventive measures are most likely to keep you healthy. Ask your health care provider for more information. Weight and diet Eat a healthy diet  Be sure to include plenty of vegetables, fruits, low-fat dairy products, and lean protein.  Do not eat a lot of foods high in solid fats, added sugars, or salt.  Get regular exercise. This is one of the most important things you can do for your health. ? Most adults should exercise for at least 150 minutes each week. The exercise should increase your heart rate and make you sweat (moderate-intensity exercise). ? Most adults should also do strengthening exercises at least twice a week. This is in addition to the moderate-intensity exercise.  Maintain a healthy weight  Body mass index (BMI) is a measurement that can be used to identify possible weight problems. It estimates body fat based on height and weight. Your health care provider can help determine your BMI and help you achieve or maintain a healthy weight.  For females 60 years of age and older: ? A BMI below 18.5 is considered underweight. ? A BMI of 18.5 to 24.9 is normal. ? A BMI of 25 to 29.9 is considered overweight. ? A BMI of 30 and above is considered obese.  Watch levels of cholesterol and blood lipids  You should start having your blood tested for lipids and cholesterol at 45 years of age,  then have this test every 5 years.  You may need to have your cholesterol levels checked more often if: ? Your lipid or cholesterol levels are high. ? You are older than 45 years of age. ? You are at high risk for heart disease.  Cancer screening Lung Cancer  Lung cancer screening is recommended for adults 60-47 years old who are at high risk for lung cancer because of a history of smoking.  A yearly low-dose CT scan of the lungs is recommended for people who: ? Currently smoke. ? Have quit within the past 15 years. ? Have at least a 30-pack-year history of smoking. A pack year is smoking an average of one pack of cigarettes a day for 1 year.  Yearly screening should continue until it has been 15 years since you quit.  Yearly screening should stop if you develop a health problem that would prevent you from having lung cancer treatment.  Breast Cancer  Practice breast self-awareness. This means understanding how your breasts normally appear and feel.  It also means doing regular breast self-exams. Let your health care provider know about any changes, no matter how small.  If you are in your 20s or 30s, you should have a clinical breast exam (CBE) by a health care provider every 1-3 years as part of a regular health exam.  If you are 40 or older, have a CBE every year. Also consider having a breast X-ray (mammogram) every year.  If you have a  family history of breast cancer, talk to your health care provider about genetic screening.  If you are at high risk for breast cancer, talk to your health care provider about having an MRI and a mammogram every year.  Breast cancer gene (BRCA) assessment is recommended for women who have family members with BRCA-related cancers. BRCA-related cancers include: ? Breast. ? Ovarian. ? Tubal. ? Peritoneal cancers.  Results of the assessment will determine the need for genetic counseling and BRCA1 and BRCA2 testing.  Cervical Cancer Your  health care provider may recommend that you be screened regularly for cancer of the pelvic organs (ovaries, uterus, and vagina). This screening involves a pelvic examination, including checking for microscopic changes to the surface of your cervix (Pap test). You may be encouraged to have this screening done every 3 years, beginning at age 58.  For women ages 84-65, health care providers may recommend pelvic exams and Pap testing every 3 years, or they may recommend the Pap and pelvic exam, combined with testing for human papilloma virus (HPV), every 5 years. Some types of HPV increase your risk of cervical cancer. Testing for HPV may also be done on women of any age with unclear Pap test results.  Other health care providers may not recommend any screening for nonpregnant women who are considered low risk for pelvic cancer and who do not have symptoms. Ask your health care provider if a screening pelvic exam is right for you.  If you have had past treatment for cervical cancer or a condition that could lead to cancer, you need Pap tests and screening for cancer for at least 20 years after your treatment. If Pap tests have been discontinued, your risk factors (such as having a new sexual partner) need to be reassessed to determine if screening should resume. Some women have medical problems that increase the chance of getting cervical cancer. In these cases, your health care provider may recommend more frequent screening and Pap tests.  Colorectal Cancer  This type of cancer can be detected and often prevented.  Routine colorectal cancer screening usually begins at 45 years of age and continues through 45 years of age.  Your health care provider may recommend screening at an earlier age if you have risk factors for colon cancer.  Your health care provider may also recommend using home test kits to check for hidden blood in the stool.  A small camera at the end of a tube can be used to examine your  colon directly (sigmoidoscopy or colonoscopy). This is done to check for the earliest forms of colorectal cancer.  Routine screening usually begins at age 65.  Direct examination of the colon should be repeated every 5-10 years through 45 years of age. However, you may need to be screened more often if early forms of precancerous polyps or small growths are found.  Skin Cancer  Check your skin from head to toe regularly.  Tell your health care provider about any new moles or changes in moles, especially if there is a change in a mole's shape or color.  Also tell your health care provider if you have a mole that is larger than the size of a pencil eraser.  Always use sunscreen. Apply sunscreen liberally and repeatedly throughout the day.  Protect yourself by wearing long sleeves, pants, a wide-brimmed hat, and sunglasses whenever you are outside.  Heart disease, diabetes, and high blood pressure  High blood pressure causes heart disease and increases the risk of  stroke. High blood pressure is more likely to develop in: ? People who have blood pressure in the high end of the normal range (130-139/85-89 mm Hg). ? People who are overweight or obese. ? People who are African American.  If you are 4-54 years of age, have your blood pressure checked every 3-5 years. If you are 42 years of age or older, have your blood pressure checked every year. You should have your blood pressure measured twice-once when you are at a hospital or clinic, and once when you are not at a hospital or clinic. Record the average of the two measurements. To check your blood pressure when you are not at a hospital or clinic, you can use: ? An automated blood pressure machine at a pharmacy. ? A home blood pressure monitor.  If you are between 24 years and 71 years old, ask your health care provider if you should take aspirin to prevent strokes.  Have regular diabetes screenings. This involves taking a blood sample  to check your fasting blood sugar level. ? If you are at a normal weight and have a low risk for diabetes, have this test once every three years after 45 years of age. ? If you are overweight and have a high risk for diabetes, consider being tested at a younger age or more often. Preventing infection Hepatitis B  If you have a higher risk for hepatitis B, you should be screened for this virus. You are considered at high risk for hepatitis B if: ? You were born in a country where hepatitis B is common. Ask your health care provider which countries are considered high risk. ? Your parents were born in a high-risk country, and you have not been immunized against hepatitis B (hepatitis B vaccine). ? You have HIV or AIDS. ? You use needles to inject street drugs. ? You live with someone who has hepatitis B. ? You have had sex with someone who has hepatitis B. ? You get hemodialysis treatment. ? You take certain medicines for conditions, including cancer, organ transplantation, and autoimmune conditions.  Hepatitis C  Blood testing is recommended for: ? Everyone born from [birth year] through 1963. ? Anyone with known risk factors for hepatitis C.  Sexually transmitted infections (STIs)  You should be screened for sexually transmitted infections (STIs) including gonorrhea and chlamydia if: ? You are sexually active and are younger than 45 years of age. ? You are older than 45 years of age and your health care provider tells you that you are at risk for this type of infection. ? Your sexual activity has changed since you were last screened and you are at an increased risk for chlamydia or gonorrhea. Ask your health care provider if you are at risk.  If you do not have HIV, but are at risk, it may be recommended that you take a prescription medicine daily to prevent HIV infection. This is called pre-exposure prophylaxis (PrEP). You are considered at risk if: ? You are sexually active and do not  regularly use condoms or know the HIV status of your partner(s). ? You take drugs by injection. ? You are sexually active with a partner who has HIV.  Talk with your health care provider about whether you are at high risk of being infected with HIV. If you choose to begin PrEP, you should first be tested for HIV. You should then be tested every 3 months for as long as you are taking PrEP. Pregnancy  If  you are premenopausal and you may become pregnant, ask your health care provider about preconception counseling.  If you may become pregnant, take 400 to 800 micrograms (mcg) of folic acid every day.  If you want to prevent pregnancy, talk to your health care provider about birth control (contraception). Osteoporosis and menopause  Osteoporosis is a disease in which the bones lose minerals and strength with aging. This can result in serious bone fractures. Your risk for osteoporosis can be identified using a bone density scan.  If you are 76 years of age or older, or if you are at risk for osteoporosis and fractures, ask your health care provider if you should be screened.  Ask your health care provider whether you should take a calcium or vitamin D supplement to lower your risk for osteoporosis.  Menopause may have certain physical symptoms and risks.  Hormone replacement therapy may reduce some of these symptoms and risks. Talk to your health care provider about whether hormone replacement therapy is right for you. Follow these instructions at home:  Schedule regular health, dental, and eye exams.  Stay current with your immunizations.  Do not use any tobacco products including cigarettes, chewing tobacco, or electronic cigarettes.  If you are pregnant, do not drink alcohol.  If you are breastfeeding, limit how much and how often you drink alcohol.  Limit alcohol intake to no more than 1 drink per day for nonpregnant women. One drink equals 12 ounces of beer, 5 ounces of wine, or  1 ounces of hard liquor.  Do not use street drugs.  Do not share needles.  Ask your health care provider for help if you need support or information about quitting drugs.  Tell your health care provider if you often feel depressed.  Tell your health care provider if you have ever been abused or do not feel safe at home. This information is not intended to replace advice given to you by your health care provider. Make sure you discuss any questions you have with your health care provider. Document Released: 03/09/2011 Document Revised: 01/30/2016 Document Reviewed: 05/28/2015 Elsevier Interactive Patient Education  [employer].

## 2018-03-09 LAB — CMP14+EGFR
A/G RATIO: 1.8 (ref 1.2–2.2)
ALK PHOS: 80 IU/L (ref 39–117)
ALT: 29 IU/L (ref 0–32)
AST: 22 IU/L (ref 0–40)
Albumin: 4.5 g/dL (ref 3.5–5.5)
BUN/Creatinine Ratio: 16 (ref 9–23)
BUN: 10 mg/dL (ref 6–24)
Bilirubin Total: 0.4 mg/dL (ref 0.0–1.2)
CO2: 24 mmol/L (ref 20–29)
Calcium: 9.2 mg/dL (ref 8.7–10.2)
Chloride: 103 mmol/L (ref 96–106)
Creatinine, Ser: 0.62 mg/dL (ref 0.57–1.00)
GFR calc Af Amer: 126 mL/min/{1.73_m2} (ref >59)
GFR calc non Af Amer: 109 mL/min/{1.73_m2} (ref >59)
GLUCOSE: 76 mg/dL (ref 65–99)
Globulin, Total: 2.5 g/dL (ref 1.5–4.5)
POTASSIUM: 4.4 mmol/L (ref 3.5–5.2)
Sodium: 141 mmol/L (ref 134–144)
Total Protein: 7 g/dL (ref 6.0–8.5)

## 2018-03-09 LAB — CBC
HEMATOCRIT: 42.4 % (ref 34.0–46.6)
Hemoglobin: 13.5 g/dL (ref 11.1–15.9)
MCH: 27.4 pg (ref 26.6–33.0)
MCHC: 31.8 g/dL (ref 31.5–35.7)
MCV: 86 fL (ref 79–97)
PLATELETS: 263 10*3/uL (ref 150–450)
RBC: 4.92 x10E6/uL (ref 3.77–5.28)
RDW: 12.7 % (ref 12.3–15.4)
WBC: 6 10*3/uL (ref 3.4–10.8)

## 2018-03-09 LAB — LIPID PANEL
CHOLESTEROL TOTAL: 188 mg/dL (ref 100–199)
Chol/HDL Ratio: 4.7 ratio — ABNORMAL HIGH (ref 0.0–4.4)
HDL: 40 mg/dL (ref >39)
LDL Calculated: 109 mg/dL — ABNORMAL HIGH (ref 0–99)
Triglycerides: 193 mg/dL — ABNORMAL HIGH (ref 0–149)
VLDL CHOLESTEROL CAL: 39 mg/dL (ref 5–40)

## 2018-03-09 LAB — FERRITIN: Ferritin: 325 ng/mL — ABNORMAL HIGH (ref 15–150)

## 2018-03-09 LAB — VITAMIN B12: Vitamin B-12: 688 pg/mL (ref 232–1245)

## 2018-03-14 ENCOUNTER — Other Ambulatory Visit: Payer: BLUE CROSS/BLUE SHIELD

## 2018-03-16 ENCOUNTER — Ambulatory Visit
Admission: RE | Admit: 2018-03-16 | Discharge: 2018-03-16 | Disposition: A | Discharge status: Home / Self Care | Payer: BLUE CROSS/BLUE SHIELD | Source: Ambulatory Visit | Attending: Family Medicine | Admitting: Family Medicine

## 2018-03-16 DIAGNOSIS — N644 Mastodynia: Secondary | ICD-10-CM

## 2018-03-17 DIAGNOSIS — K219 Gastro-esophageal reflux disease without esophagitis: Secondary | ICD-10-CM | POA: Insufficient documentation

## 2018-03-17 NOTE — Assessment & Plan Note (Signed)
Well-controlled.  Continue current regimen. 

## 2018-03-17 NOTE — Assessment & Plan Note (Signed)
Lightheadedness occasionally. Orthostatics negative during this visit. Check CBC along with labs. Blood pressure runs low chronically so suspect this may be the culprit.

## 2018-03-17 NOTE — Assessment & Plan Note (Signed)
Recheck LFTs today 

## 2018-03-17 NOTE — Assessment & Plan Note (Signed)
Fasting today, check lipids 

## 2018-03-25 ENCOUNTER — Encounter: Payer: Self-pay | Admitting: Family Medicine

## 2018-05-03 ENCOUNTER — Other Ambulatory Visit: Payer: Self-pay | Admitting: Nurse Practitioner

## 2018-06-05 ENCOUNTER — Other Ambulatory Visit: Payer: Self-pay | Admitting: Nurse Practitioner

## 2018-06-26 ENCOUNTER — Ambulatory Visit (HOSPITAL_COMMUNITY)
Admission: EM | Admit: 2018-06-26 | Discharge: 2018-06-26 | Disposition: A | Discharge status: Home / Self Care | Payer: BLUE CROSS/BLUE SHIELD | Attending: Internal Medicine | Admitting: Internal Medicine

## 2018-06-26 ENCOUNTER — Other Ambulatory Visit: Payer: Self-pay

## 2018-06-26 ENCOUNTER — Encounter (HOSPITAL_COMMUNITY): Payer: Self-pay | Admitting: Emergency Medicine

## 2018-06-26 DIAGNOSIS — J029 Acute pharyngitis, unspecified: Secondary | ICD-10-CM

## 2018-06-26 DIAGNOSIS — R059 Cough, unspecified: Secondary | ICD-10-CM

## 2018-06-26 DIAGNOSIS — R05 Cough: Secondary | ICD-10-CM | POA: Diagnosis not present

## 2018-06-26 MED ORDER — BENZONATATE 100 MG PO CAPS: 100.0000 mg | ORAL_CAPSULE | Freq: Three times a day (TID) | ORAL | 0 refills | Status: DC

## 2018-06-26 MED ORDER — ACETAMINOPHEN 500 MG PO TABS: 500.0000 mg | ORAL_TABLET | Freq: Four times a day (QID) | ORAL | 0 refills | Status: DC | PRN

## 2018-06-26 MED ORDER — AZITHROMYCIN 250 MG PO TABS: ORAL_TABLET | ORAL | 0 refills | Status: AC

## 2018-06-26 NOTE — ED Provider Notes (Signed)
[name]    CSN: [id number] Arrival date & time: 06/26/18  1159     History   Chief Complaint Chief Complaint  Patient presents with  . Sore Throat  . Cough    HPI Savannah Banks is a 47 y.o. female.   [place] presents with complaints of worsening of cough and sore throat. Started over a week ago. Cough is non productive. Causes chest pain  With coughing. Causes headache as well. Sore throat. No known fevers. No gi/gu complaints. No shortness of breath . Feels very fatigued. No ear pain. No rash. Has been taking robitussin which has only helped temporarily. No known ill contacts. No history of asthma. Doesn't smoke. Hx of gerd, dysphagia, dizziness, fibroadenoma of breast.     ROS per HPI.      History reviewed. No pertinent past medical history.  Patient Active Problem List   Diagnosis Date Noted  . GERD (gastroesophageal reflux disease) 03/17/2018  . 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 Neoplasm of uncertain behavior 63/14/9702  . Elevated transaminase level 10/12/2016  . Constipation 10/08/2016  . Dysphagia 10/08/2016  . Lumbar back pain 10/08/2016  . Dyspnea 10/24/2015  . Lactose intolerance 10/24/2015  . Muscle spasm of back 07/25/2015  . Atypical chest pain 07/25/2015  . Hyperlipidemia 03/04/2015  . Numbness of fingers of both hands 03/04/2015  . IUD (intrauterine device) in place 02/11/2015  . Preventative health care 02/05/2015  . Fibroadenoma of breast 02/05/2015  . Dizziness 02/05/2015    Past Surgical History:  Procedure Laterality Date  . BREAST BIOPSY Left pt unsure   benign  . TONSILLECTOMY      OB History    Gravida  3   Para  1   Term  1   Preterm  0   AB  2   Living  1     SAB  2   TAB      Ectopic      Multiple      Live Births               Home Medications    Prior to Admission medications   Medication Sig Start Date End Date Taking? Authorizing Provider  omeprazole (PRILOSEC) 20 MG capsule TAKE 1  CAPSULE BY MOUTH EVERY MORNING 06/06/18  Yes Willia Craze, NP  acetaminophen (TYLENOL) 500 MG tablet Take 1 tablet (500 mg total) by mouth every 6 (six) hours as needed. 06/26/18   Zigmund Gottron, NP  azithromycin (ZITHROMAX) 250 MG tablet Take 2 tablets (500 mg total) by mouth daily for 1 day, THEN 1 tablet (250 mg total) daily for 4 days. 06/26/18 07/01/18  Zigmund Gottron, NP  benzonatate (TESSALON) 100 MG capsule Take 1 capsule (100 mg total) by mouth every 8 (eight) hours. 06/26/18   Zigmund Gottron, NP    Family History Family History  Problem Relation Age of Onset  . Diabetes Mother   . Hypertension Mother   . Liver cancer Father     Social History Social History   Tobacco Use  . Smoking status: Never Smoker  . Smokeless tobacco: Never Used  Substance Use Topics  . Alcohol use: No  . Drug use: No     Allergies   Shellfish allergy   Review of Systems Review of Systems   Physical Exam Triage Vital Signs ED Triage Vitals  Enc Vitals Group     BP 06/26/18 1235 104/61     Pulse Rate 06/26/18  1235 74     Resp 06/26/18 1235 16     Temp 06/26/18 1235 99.4 F (37.4 C)     Temp Source 06/26/18 1235 Oral     SpO2 06/26/18 1235 100 %     Weight --      Height --      Head Circumference --      Peak Flow --      Pain Score 06/26/18 1234 8     Pain Loc --      Pain Edu? --      Excl. in [place]? --    No data found.  Updated Vital Signs BP 104/61 (BP Location: Right Arm)   Pulse 74   Temp 99.4 F (37.4 C) (Oral)   Resp 16   SpO2 100%    Physical Exam  Constitutional: She is oriented to person, place, and time. She appears well-developed and well-nourished. No distress.  HENT:  Head: Normocephalic and atraumatic.  Right Ear: Tympanic membrane, external ear and ear canal normal.  Left Ear: Tympanic membrane, external ear and ear canal normal.  Nose: Nose normal.  Mouth/Throat: Uvula is midline, oropharynx is clear and moist and mucous membranes are  normal. Tonsils are 0 on the right. Tonsils are 0 on the left. No tonsillar exudate.  Eyes: Pupils are equal, round, and reactive to light. Conjunctivae and EOM are normal.  Cardiovascular: Normal rate, regular rhythm and normal heart sounds.  Pulmonary/Chest: Effort normal and breath sounds normal.  Lymphadenopathy:    She has no cervical adenopathy.  Neurological: She is alert and oriented to person, place, and time.  Skin: Skin is warm and dry.     UC Treatments / Results  Labs (all labs ordered are listed, but only abnormal results are displayed) Labs Reviewed - No data to display  EKG None  Radiology No results found.  Procedures Procedures (including critical care time)  Medications Ordered in UC Medications - No data to display  Initial Impression / Assessment and Plan / UC Course  I have reviewed the triage vital signs and the nursing notes.  Pertinent labs & imaging results that were available during my care of the patient were reviewed by me and considered in my medical decision making (see chart for details).     Temp 99.4 in clinic today. Persistent and worsening of symptoms for greater than 1 week with failure of OTC treatment. Lungs clear here today, will cover empirically with azithromycin however. Return precautions provided. If symptoms worsen or do not improve in the next week to return to be seen or to follow up with PCP.  Patient verbalized understanding and agreeable to plan.   Final Clinical Impressions(s) / UC Diagnoses   Final diagnoses:  Cough  Sore throat     Discharge Instructions     Complete course of antibiotics.  Push fluids to ensure adequate hydration and keep secretions thin.  May continue with over the counter medication as needed for symptoms.  Tessalon as needed for cough.  Tylenol as needed for fever or headache.  If symptoms worsen or do not improve in the next week to return to be seen or to follow up with your primary care  provider.    ED Prescriptions    Medication Sig Dispense Auth. Provider   azithromycin (ZITHROMAX) 250 MG tablet Take 2 tablets (500 mg total) by mouth daily for 1 day, THEN 1 tablet (250 mg total) daily for 4 days. 6 tablet Augusto Gamble  B, NP   benzonatate (TESSALON) 100 MG capsule Take 1 capsule (100 mg total) by mouth every 8 (eight) hours. 21 capsule Augusto Gamble B, NP   acetaminophen (TYLENOL) 500 MG tablet Take 1 tablet (500 mg total) by mouth every 6 (six) hours as needed. 30 tablet Zigmund Gottron, NP     Controlled Substance Prescriptions [place] Controlled Substance Registry consulted? Not Applicable   Zigmund Gottron, NP 06/26/18 1307

## 2018-06-26 NOTE — Discharge Instructions (Addendum)
Complete course of antibiotics.  Push fluids to ensure adequate hydration and keep secretions thin.  May continue with over the counter medication as needed for symptoms.  Tessalon as needed for cough.  Tylenol as needed for fever or headache.  If symptoms worsen or do not improve in the next week to return to be seen or to follow up with your primary care provider.

## 2018-06-26 NOTE — ED Triage Notes (Signed)
The patient presented to the [facility] with a cough and sore throat x 1 week. The patient denied any measured fever at home.

## 2018-07-04 ENCOUNTER — Other Ambulatory Visit: Payer: Self-pay | Admitting: Nurse Practitioner

## 2018-09-20 ENCOUNTER — Other Ambulatory Visit: Payer: Self-pay | Admitting: Nurse Practitioner

## 2018-09-30 ENCOUNTER — Encounter: Payer: Self-pay | Admitting: Family Medicine

## 2018-10-09 ENCOUNTER — Ambulatory Visit (HOSPITAL_COMMUNITY)
Admission: EM | Admit: 2018-10-09 | Discharge: 2018-10-09 | Disposition: A | Discharge status: Home / Self Care | Payer: BLUE CROSS/BLUE SHIELD | Attending: Family Medicine | Admitting: Family Medicine

## 2018-10-09 ENCOUNTER — Other Ambulatory Visit: Payer: Self-pay

## 2018-10-09 DIAGNOSIS — J069 Acute upper respiratory infection, unspecified: Secondary | ICD-10-CM

## 2018-10-09 DIAGNOSIS — B9789 Other viral agents as the cause of diseases classified elsewhere: Secondary | ICD-10-CM | POA: Diagnosis not present

## 2018-10-09 MED ORDER — BENZONATATE 200 MG PO CAPS: 200.0000 mg | ORAL_CAPSULE | Freq: Two times a day (BID) | ORAL | 0 refills | Status: DC | PRN

## 2018-10-09 MED ORDER — DM-GUAIFENESIN ER 30-600 MG PO TB12: 1.0000 | ORAL_TABLET | Freq: Two times a day (BID) | ORAL | 0 refills | Status: DC

## 2018-10-09 NOTE — Discharge Instructions (Signed)
Drink plenty of fluids Use a humidifier if you have one Take the Tessalon 2 times a day Take the Mucinex 2 times a day in addition This will help with your congestion and your cough Continue medications until your infection improves Return if fails to improve in 1 week

## 2018-10-09 NOTE — ED Provider Notes (Signed)
[place]    CSN: [id number] Arrival date & time: 10/09/18  1138     History   Chief Complaint Chief Complaint  Patient presents with  . Cough    HPI Savannah Banks is a 48 y.o. female.   HPI  Patient has a cough and runny nose, scratchy throat, 3 days.  No fever or chills.  No nausea or vomiting.  No known exposure to influenza.  She like medicines to control her symptoms because she works 6 days a week.  She does not intend on stopping working.  She would like better cough control.  She tried her Tessalon 100 mg tablet and it was not strong enough. No sputum production.  No chest pain.  No fever.  No weakness   No past medical history on file.  Patient Active Problem List   Diagnosis Date Noted  . GERD (gastroesophageal reflux disease) 03/17/2018  . 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 Neoplasm of uncertain behavior 54/00/8676  . Elevated transaminase level 10/12/2016  . Constipation 10/08/2016  . Dysphagia 10/08/2016  . Lumbar back pain 10/08/2016  . Dyspnea 10/24/2015  . Lactose intolerance 10/24/2015  . Muscle spasm of back 07/25/2015  . Atypical chest pain 07/25/2015  . Hyperlipidemia 03/04/2015  . Numbness of fingers of both hands 03/04/2015  . IUD (intrauterine device) in place 02/11/2015  . Preventative health care 02/05/2015  . Fibroadenoma of breast 02/05/2015  . Dizziness 02/05/2015    Past Surgical History:  Procedure Laterality Date  . BREAST BIOPSY Left pt unsure   benign  . TONSILLECTOMY      OB History    Gravida  3   Para  1   Term  1   Preterm  0   AB  2   Living  1     SAB  2   TAB      Ectopic      Multiple      Live Births               Home Medications    Prior to Admission medications   Medication Sig Start Date End Date Taking? Authorizing Provider  acetaminophen (TYLENOL) 500 MG tablet Take 1 tablet (500 mg total) by mouth every 6 (six) hours as needed. 06/26/18   Zigmund Gottron, NP    benzonatate (TESSALON) 200 MG capsule Take 1 capsule (200 mg total) by mouth 2 (two) times daily as needed for cough. 10/09/18   Raylene Everts, MD  dextromethorphan-guaiFENesin [facility] DM) 30-600 MG 12hr tablet Take 1 tablet by mouth 2 (two) times daily. 10/09/18   Raylene Everts, MD  omeprazole (PRILOSEC) 20 MG capsule TAKE 1 CAPSULE BY MOUTH EVERY MORNING 09/21/18   Willia Craze, NP    Family History Family History  Problem Relation Age of Onset  . Diabetes Mother   . Hypertension Mother   . Liver cancer Father     Social History Social History   Tobacco Use  . Smoking status: Never Smoker  . Smokeless tobacco: Never Used  Substance Use Topics  . Alcohol use: No  . Drug use: No     Allergies   Shellfish allergy   Review of Systems Review of Systems  Constitutional: Negative for chills and fever.  HENT: Positive for rhinorrhea and sore throat. Negative for ear pain.   Eyes: Negative for pain and visual disturbance.  Respiratory: Positive for cough. Negative for shortness of breath.   Cardiovascular: Negative  for chest pain and palpitations.  Gastrointestinal: Negative for abdominal pain and vomiting.  Genitourinary: Negative for dysuria and hematuria.  Musculoskeletal: Negative for arthralgias and back pain.  Skin: Negative for color change and rash.  Neurological: Negative for seizures and syncope.  All other systems reviewed and are negative.    Physical Exam Triage Vital Signs ED Triage Vitals  Enc Vitals Group     BP 10/09/18 1310 121/74     Pulse Rate 10/09/18 1310 79     Resp 10/09/18 1310 16     Temp 10/09/18 1310 97.8 F (36.6 C)     Temp src --      SpO2 10/09/18 1310 100 %     Weight 10/09/18 1311 135 lb (61.2 kg)   No data found.  Updated Vital Signs BP 121/74 (BP Location: Right Arm)   Pulse 79   Temp 97.8 F (36.6 C)   Resp 16   Wt 61.2 kg   SpO2 100%   BMI 24.69 kg/m       Physical Exam Constitutional:      General:  She is not in acute distress.    Appearance: She is well-developed.  HENT:     Head: Normocephalic and atraumatic.     Nose:     Comments: Clear rhinorrhea Eyes:     Conjunctiva/sclera: Conjunctivae normal.     Pupils: Pupils are equal, round, and reactive to light.  Neck:     Musculoskeletal: Normal range of motion.  Cardiovascular:     Rate and Rhythm: Normal rate.     Heart sounds: Normal heart sounds.  Pulmonary:     Effort: Pulmonary effort is normal. No respiratory distress.     Breath sounds: Normal breath sounds.  Abdominal:     General: There is no distension.     Palpations: Abdomen is soft.  Musculoskeletal: Normal range of motion.  Skin:    General: Skin is warm and dry.  Neurological:     Mental Status: She is alert.      UC Treatments / Results  Labs (all labs ordered are listed, but only abnormal results are displayed) Labs Reviewed - No data to display  EKG None  Radiology No results found.  Procedures Procedures (including critical care time)  Medications Ordered in UC Medications - No data to display  Initial Impression / Assessment and Plan / UC Course  I have reviewed the triage vital signs and the nursing notes.  Pertinent labs & imaging results that were available during my care of the patient were reviewed by me and considered in my medical decision making (see chart for details).     We reviewed that this is a viral illness that would not improve with antibiotics.  I am offering her symptomatic care.  Fluids.  Return if worse instead of better at any time Final Clinical Impressions(s) / UC Diagnoses   Final diagnoses:  Viral URI with cough     Discharge Instructions     Drink plenty of fluids Use a humidifier if you have one Take the Tessalon 2 times a day Take the Mucinex 2 times a day in addition This will help with your congestion and your cough Continue medications until your infection improves Return if fails to improve  in 1 week    ED Prescriptions    Medication Sig Dispense Auth. Provider   benzonatate (TESSALON) 200 MG capsule Take 1 capsule (200 mg total) by mouth 2 (two) times daily as  needed for cough. 20 capsule Raylene Everts, MD   dextromethorphan-guaiFENesin [facility] DM) 30-600 MG 12hr tablet Take 1 tablet by mouth 2 (two) times daily. 20 tablet Raylene Everts, MD     Controlled Substance Prescriptions [place] Controlled Substance Registry consulted? Not Applicable   Raylene Everts, MD 10/09/18 1343

## 2018-10-09 NOTE — ED Triage Notes (Signed)
Pt cc coughing and stretchy throat x 3 days. Pt states the meds she got didn't work.

## 2018-10-23 ENCOUNTER — Ambulatory Visit (HOSPITAL_COMMUNITY)
Admission: EM | Admit: 2018-10-23 | Discharge: 2018-10-23 | Disposition: A | Discharge status: Home / Self Care | Payer: BLUE CROSS/BLUE SHIELD | Attending: Family Medicine | Admitting: Family Medicine

## 2018-10-23 ENCOUNTER — Other Ambulatory Visit: Payer: Self-pay

## 2018-10-23 ENCOUNTER — Encounter (HOSPITAL_COMMUNITY): Payer: Self-pay | Admitting: Physician Assistant

## 2018-10-23 DIAGNOSIS — J0140 Acute pansinusitis, unspecified: Secondary | ICD-10-CM

## 2018-10-23 MED ORDER — AMOXICILLIN-POT CLAVULANATE 875-125 MG PO TABS: 1.0000 | ORAL_TABLET | Freq: Two times a day (BID) | ORAL | 0 refills | Status: DC

## 2018-10-23 MED ORDER — PREDNISONE 50 MG PO TABS: 50.0000 mg | ORAL_TABLET | Freq: Every day | ORAL | 0 refills | Status: DC

## 2018-10-23 MED ORDER — IPRATROPIUM BROMIDE 0.06 % NA SOLN: 2.0000 | Freq: Four times a day (QID) | NASAL | 0 refills | Status: DC

## 2018-10-23 NOTE — Discharge Instructions (Signed)
Start prednisone as directed. Start atrovent nasal spray for nasal congestion/drainage. You can use over the counter nasal saline rinse such as neti pot for nasal congestion. Keep hydrated, your urine should be clear to pale yellow in color. Tylenol/motrin for fever and pain. Monitor for any worsening of symptoms, chest pain, shortness of breath, wheezing, swelling of the throat, follow up for reevaluation.   If symptoms continues after prednisone, start augmentin as directed.

## 2018-10-23 NOTE — ED Triage Notes (Signed)
Pt states she has a ichthy throat and deep cough x 2 weeks now. Pt was just here a week ago.

## 2018-10-23 NOTE — ED Provider Notes (Signed)
[place]    CSN: [id number] Arrival date & time: 10/23/18  1000     History   Chief Complaint Chief Complaint  Patient presents with  . Cough    HPI Savannah Banks is a 48 y.o. female.   48 year old female comes in for 2-week history of URI symptoms.  She was seen last week, diagnosed with viral URI, and was given symptomatic treatment without relief.  States has had increased itching in the throat, particularly when she lays down.  And has a deep cough.  She denies productive cough.  States has chest and back soreness with cough.  Denies shortness of breath, wheezing.  Denies fever, chills, night sweats. Continues with rhinorrhea, and can feel post nasal drip when laying down. Denies trouble swallowing, swelling of the throat, tripoding, drooling, trismus.  Never smoker.  Has not tried any other medicine.     History reviewed. No pertinent past medical history.  Patient Active Problem List   Diagnosis Date Noted  . GERD (gastroesophageal reflux disease) 03/17/2018  . 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 Neoplasm of uncertain behavior 29/52/8413  . Elevated transaminase level 10/12/2016  . Constipation 10/08/2016  . Dysphagia 10/08/2016  . Lumbar back pain 10/08/2016  . Dyspnea 10/24/2015  . Lactose intolerance 10/24/2015  . Muscle spasm of back 07/25/2015  . Atypical chest pain 07/25/2015  . Hyperlipidemia 03/04/2015  . Numbness of fingers of both hands 03/04/2015  . IUD (intrauterine device) in place 02/11/2015  . Preventative health care 02/05/2015  . Fibroadenoma of breast 02/05/2015  . Dizziness 02/05/2015    Past Surgical History:  Procedure Laterality Date  . BREAST BIOPSY Left pt unsure   benign  . TONSILLECTOMY      OB History    Gravida  3   Para  1   Term  1   Preterm  0   AB  2   Living  1     SAB  2   TAB      Ectopic      Multiple      Live Births               Home Medications    Prior to Admission  medications   Medication Sig Start Date End Date Taking? Authorizing Provider  acetaminophen (TYLENOL) 500 MG tablet Take 1 tablet (500 mg total) by mouth every 6 (six) hours as needed. 06/26/18   Zigmund Gottron, NP  amoxicillin-clavulanate (AUGMENTIN) 875-125 MG tablet Take 1 tablet by mouth every 12 (twelve) hours. 10/23/18   Tasia Catchings,  V, PA-C  ipratropium (ATROVENT) 0.06 % nasal spray Place 2 sprays into both nostrils 4 (four) times daily. 10/23/18   Tasia Catchings,  V, PA-C  omeprazole (PRILOSEC) 20 MG capsule TAKE 1 CAPSULE BY MOUTH EVERY MORNING 09/21/18   Willia Craze, NP  predniSONE (DELTASONE) 50 MG tablet Take 1 tablet (50 mg total) by mouth daily. 10/23/18   Ok Edwards, PA-C    Family History Family History  Problem Relation Age of Onset  . Diabetes Mother   . Hypertension Mother   . Liver cancer Father     Social History Social History   Tobacco Use  . Smoking status: Never Smoker  . Smokeless tobacco: Never Used  Substance Use Topics  . Alcohol use: No  . Drug use: No     Allergies   Shellfish allergy   Review of Systems Review of Systems  Reason unable to perform ROS:  See HPI as above.     Physical Exam Triage Vital Signs ED Triage Vitals  Enc Vitals Group     BP 10/23/18 1014 112/74     Pulse Rate 10/23/18 1014 79     Resp 10/23/18 1014 16     Temp 10/23/18 1014 98 F (36.7 C)     Temp Source 10/23/18 1014 Oral     SpO2 10/23/18 1014 98 %     Weight 10/23/18 1019 125 lb (56.7 kg)     Height --      Head Circumference --      Peak Flow --      Pain Score --      Pain Loc --      Pain Edu? --      Excl. in [place]? --    No data found.  Updated Vital Signs BP 112/74 (BP Location: Left Arm)   Pulse 79   Temp 98 F (36.7 C) (Oral)   Resp 16   Wt 125 lb (56.7 kg)   SpO2 98%   BMI 22.86 kg/m      Physical Exam Constitutional:      General: She is not in acute distress.    Appearance: She is well-developed. She is not ill-appearing,  toxic-appearing or diaphoretic.  HENT:     Head: Normocephalic and atraumatic.     Right Ear: Tympanic membrane, ear canal and external ear normal. Tympanic membrane is not erythematous or bulging.     Left Ear: Tympanic membrane, ear canal and external ear normal. Tympanic membrane is not erythematous or bulging.     Nose: Rhinorrhea present. No congestion.     Right Sinus: No maxillary sinus tenderness or frontal sinus tenderness.     Left Sinus: No maxillary sinus tenderness or frontal sinus tenderness.     Mouth/Throat:     Mouth: Mucous membranes are moist.     Pharynx: Oropharynx is clear. Uvula midline.  Eyes:     Conjunctiva/sclera: Conjunctivae normal.     Pupils: Pupils are equal, round, and reactive to light.  Neck:     Musculoskeletal: Normal range of motion and neck supple.  Cardiovascular:     Rate and Rhythm: Normal rate and regular rhythm.     Heart sounds: Normal heart sounds. No murmur. No friction rub. No gallop.   Pulmonary:     Effort: Pulmonary effort is normal.     Breath sounds: Normal breath sounds. No decreased breath sounds, wheezing, rhonchi or rales.  Lymphadenopathy:     Cervical: No cervical adenopathy.  Skin:    General: Skin is warm and dry.  Neurological:     Mental Status: She is alert and oriented to person, place, and time.  Psychiatric:        Behavior: Behavior normal.        Judgment: Judgment normal.      UC Treatments / Results  Labs (all labs ordered are listed, but only abnormal results are displayed) Labs Reviewed - No data to display  EKG None  Radiology No results found.  Procedures Procedures (including critical care time)  Medications Ordered in UC Medications - No data to display  Initial Impression / Assessment and Plan / UC Course  I have reviewed the triage vital signs and the nursing notes.  Pertinent labs & imaging results that were available during my care of the patient were reviewed by me and considered  in my medical decision making (see chart for details).  Will have patient start prednisone and nasal spray.  Push fluids.  Written Rx of Augmentin provided, patient can start to cover for bacterial sinusitis if symptoms do not improving with prednisone.  Return precautions given.  Patient expresses understanding and agrees to plan.  Final Clinical Impressions(s) / UC Diagnoses   Final diagnoses:  Acute non-recurrent pansinusitis    ED Prescriptions    Medication Sig Dispense Auth. Provider   predniSONE (DELTASONE) 50 MG tablet Take 1 tablet (50 mg total) by mouth daily. 5 tablet ,  V, PA-C   ipratropium (ATROVENT) 0.06 % nasal spray Place 2 sprays into both nostrils 4 (four) times daily. 15 mL ,  V, PA-C   amoxicillin-clavulanate (AUGMENTIN) 875-125 MG tablet Take 1 tablet by mouth every 12 (twelve) hours. 14 tablet Tobin Chad, [place] 10/23/18 1036

## 2018-12-06 ENCOUNTER — Other Ambulatory Visit: Payer: Self-pay | Admitting: Nurse Practitioner

## 2019-01-03 ENCOUNTER — Ambulatory Visit: Payer: BLUE CROSS/BLUE SHIELD | Admitting: Family Medicine

## 2019-02-21 ENCOUNTER — Other Ambulatory Visit: Payer: Self-pay | Admitting: Nurse Practitioner

## 2019-02-22 NOTE — Telephone Encounter (Signed)
Needs an appointment.

## 2019-03-07 ENCOUNTER — Encounter: Payer: Self-pay | Admitting: Family Medicine

## 2019-03-07 LAB — HM PAP SMEAR: HM Pap smear: NEGATIVE

## 2019-06-13 ENCOUNTER — Other Ambulatory Visit: Payer: Self-pay

## 2019-06-13 ENCOUNTER — Ambulatory Visit: Payer: BLUE CROSS/BLUE SHIELD | Admitting: Family Medicine

## 2019-06-13 DIAGNOSIS — D489 Neoplasm of uncertain behavior, unspecified: Secondary | ICD-10-CM | POA: Diagnosis not present

## 2019-06-13 DIAGNOSIS — Z975 Presence of (intrauterine) contraceptive device: Secondary | ICD-10-CM | POA: Diagnosis not present

## 2019-06-13 MED ORDER — OMEPRAZOLE 20 MG PO CPDR: 20.0000 mg | DELAYED_RELEASE_CAPSULE | Freq: Every morning | ORAL | 1 refills | Status: DC

## 2019-06-13 NOTE — Patient Instructions (Addendum)
Scheduled in derm clinic to have mole removed  Be well, Dr. Ardelia Mems    Health Maintenance, Female Adopting a healthy lifestyle and getting preventive care are important in promoting health and wellness. Ask your health care provider about:  The right schedule for you to have regular tests and exams.  Things you can do on your own to prevent diseases and keep yourself healthy. What should I know about diet, weight, and exercise? Eat a healthy diet   Eat a diet that includes plenty of vegetables, fruits, low-fat dairy products, and lean protein.  Do not eat a lot of foods that are high in solid fats, added sugars, or sodium. Maintain a healthy weight Body mass index (BMI) is used to identify weight problems. It estimates body fat based on height and weight. Your health care provider can help determine your BMI and help you achieve or maintain a healthy weight. Get regular exercise Get regular exercise. This is one of the most important things you can do for your health. Most adults should:  Exercise for at least 150 minutes each week. The exercise should increase your heart rate and make you sweat (moderate-intensity exercise).  Do strengthening exercises at least twice a week. This is in addition to the moderate-intensity exercise.  Spend less time sitting. Even light physical activity can be beneficial. Watch cholesterol and blood lipids Have your blood tested for lipids and cholesterol at 46 years of age, then have this test every 5 years. Have your cholesterol levels checked more often if:  Your lipid or cholesterol levels are high.  You are older than 46 years of age.  You are at high risk for heart disease. What should I know about cancer screening? Depending on your health history and family history, you may need to have cancer screening at various ages. This may include screening for:  Breast cancer.  Cervical cancer.  Colorectal cancer.  Skin cancer.  Lung  cancer. What should I know about heart disease, diabetes, and high blood pressure? Blood pressure and heart disease  High blood pressure causes heart disease and increases the risk of stroke. This is more likely to develop in people who have high blood pressure readings, are of African descent, or are overweight.  Have your blood pressure checked: ? Every 3-5 years if you are 50-96 years of age. ? Every year if you are 90 years old or older. Diabetes Have regular diabetes screenings. This checks your fasting blood sugar level. Have the screening done:  Once every three years after age 5 if you are at a normal weight and have a low risk for diabetes.  More often and at a younger age if you are overweight or have a high risk for diabetes. What should I know about preventing infection? Hepatitis B If you have a higher risk for hepatitis B, you should be screened for this virus. Talk with your health care provider to find out if you are at risk for hepatitis B infection. Hepatitis C Testing is recommended for:  Everyone born from [birth year] through 1963.  Anyone with known risk factors for hepatitis C. Sexually transmitted infections (STIs)  Get screened for STIs, including gonorrhea and chlamydia, if: ? You are sexually active and are younger than 46 years of age. ? You are older than 46 years of age and your health care provider tells you that you are at risk for this type of infection. ? Your sexual activity has changed since you were last screened,  and you are at increased risk for chlamydia or gonorrhea. Ask your health care provider if you are at risk.  Ask your health care provider about whether you are at high risk for HIV. Your health care provider may recommend a prescription medicine to help prevent HIV infection. If you choose to take medicine to prevent HIV, you should first get tested for HIV. You should then be tested every 3 months for as long as you are taking the  medicine. Pregnancy  If you are about to stop having your period (premenopausal) and you may become pregnant, seek counseling before you get pregnant.  Take 400 to 800 micrograms (mcg) of folic acid every day if you become pregnant.  Ask for birth control (contraception) if you want to prevent pregnancy. Osteoporosis and menopause Osteoporosis is a disease in which the bones lose minerals and strength with aging. This can result in bone fractures. If you are 76 years old or older, or if you are at risk for osteoporosis and fractures, ask your health care provider if you should:  Be screened for bone loss.  Take a calcium or vitamin D supplement to lower your risk of fractures.  Be given hormone replacement therapy (HRT) to treat symptoms of menopause. Follow these instructions at home: Lifestyle  Do not use any products that contain nicotine or tobacco, such as cigarettes, e-cigarettes, and chewing tobacco. If you need help quitting, ask your health care provider.  Do not use street drugs.  Do not share needles.  Ask your health care provider for help if you need support or information about quitting drugs. Alcohol use  Do not drink alcohol if: ? Your health care provider tells you not to drink. ? You are pregnant, may be pregnant, or are planning to become pregnant.  If you drink alcohol: ? Limit how much you use to 0-1 drink a day. ? Limit intake if you are breastfeeding.  Be aware of how much alcohol is in your drink. In the [place] one drink equals one 12 oz bottle of beer (355 mL), one 5 oz glass of wine (148 mL), or one 1 oz glass of hard liquor (44 mL). General instructions  Schedule regular health, dental, and eye exams.  Stay current with your vaccines.  Tell your health care provider if: ? You often feel depressed. ? You have ever been abused or do not feel safe at home. Summary  Adopting a healthy lifestyle and getting preventive care are important in  promoting health and wellness.  Follow your health care provider's instructions about healthy diet, exercising, and getting tested or screened for diseases.  Follow your health care provider's instructions on monitoring your cholesterol and blood pressure. This information is not intended to replace advice given to you by your health care provider. Make sure you discuss any questions you have with your health care provider. Document Released: 03/09/2011 Document Revised: 08/17/2018 Document Reviewed: 08/17/2018 Elsevier Patient Education  2020 [employer].

## 2019-06-13 NOTE — Progress Notes (Signed)
Date of Visit: 06/13/2019   HPI:  Patient presents today for a well woman exam. [place] interpreter utilized during this visit.  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 Getting itchy, very bothersome. Also thinks may have more pigmented areas than previously.  GERD - taking omeprazole 20mg  daily as needed. Patient feels GERD is well controlled on this medication.  ROS: See HPI  [place]:  Medical history: hyperlipidemia, hemochromatosis carrier, GERD  PHYSICAL EXAM: BP 100/70   Pulse 75   Wt 124 lb 6.4 oz (56.4 kg)   SpO2 100%   BMI 22.75 kg/m   Gen: NAD, pleasant, cooperative HEENT: NCAT, PERRL, no palpable thyromegaly or anterior cervical lymphadenopathy Heart: RRR, no murmurs Lungs: CTAB, NWOB Abdomen: soft, nontender to palpation Neuro: grossly nonfocal, speech normal Extremities: No appreciable lower extremity edema bilaterally  Psych: normal range of affect, well groomed, speech normal in rate and volume, normal eye contact  Skin: 58mm brown nevus beneath L eye (approximately 1.5cm from lower lid) with areas of pinpoint hyperpigmentation  ASSESSMENT/PLAN:  Health maintenance:  -STD screening: declines today -pap smear: UTD -mammogram: UTD -lipid screening: current, declines repeat today -immunizations: UTD -handout given on health maintenance topics  IUD (intrauterine device) in place Patient reported this was placed in Jan 2016, but noted after visit that it  was actually placed in Jan 2015, which means it is past due for replacement. Attempted to call patient x2 on 10/9 using [place] interpreter, but someone hung up immediately each time. Will send letter to patient advising that she should discuss IUD replacement with her provider.  Neoplasm of uncertain behavior Mole beneath L eye, given changes and itching warrants removal and sending for path. Scheduled in dermatology clinic to have this done.   FOLLOW UP: Follow up in derm clinic as scheduled  [place] J. Ardelia Mems, [place]

## 2019-06-16 ENCOUNTER — Encounter: Payer: Self-pay | Admitting: Family Medicine

## 2019-06-16 NOTE — Assessment & Plan Note (Signed)
Patient reported this was placed in Jan 2016, but noted after visit that it was actually placed in Jan 2015, which means it is past due for replacement. Attempted to call patient x2 on 10/9 using [place] interpreter, but someone hung up immediately each time. Will send letter to patient advising that she should discuss IUD replacement with her provider.

## 2019-06-16 NOTE — Assessment & Plan Note (Signed)
Mole beneath L eye, given changes and itching warrants removal and sending for path. Scheduled in dermatology clinic to have this done.

## 2019-07-20 ENCOUNTER — Ambulatory Visit (INDEPENDENT_AMBULATORY_CARE_PROVIDER_SITE_OTHER): Payer: BLUE CROSS/BLUE SHIELD | Admitting: Family Medicine

## 2019-07-20 ENCOUNTER — Other Ambulatory Visit: Payer: Self-pay

## 2019-07-20 VITALS — BP 100/70 | HR 80 | Wt 124.4 lb

## 2019-07-20 DIAGNOSIS — D229 Melanocytic nevi, unspecified: Secondary | ICD-10-CM

## 2019-07-20 NOTE — Progress Notes (Signed)
   Subjective:   Patient ID: Savannah Banks    DOB: 1971-05-22, 48 y.o. female   MRN: [id number]  Savannah Banks is a 48 y.o. female  here for spot on Left facial cheek.  Spot on left Cheek: Notes spot has been there since she was born. Notes it has gotten larger x 2-3 months and seems to be getting a little darker as well. It is itchy. Denies burning or bleeding. Denies affecting vision. Denies any personal or family history of skin cancer.   Review of Systems:  Per HPI.   [place], medications and smoking status reviewed.  Objective:   BP 100/70   Pulse 80   Wt 124 lb 6.4 oz (56.4 kg)   SpO2 100%   BMI 22.75 kg/m  Vitals and nursing note reviewed.  General: pleasant lady, sitting comfortably in exam chair with interpreter at side, well nourished, well developed, in no acute distress with non-toxic appearance Resp: breathing comfortably on room air, speaking in full sentences Skin: warm, dry, 0.4x0.2 teardrop shaped inferior to left eye  Extremities: warm and well perfused MSK: gait normal    Shave Biopsy Procedure Note: After informed written consent was obtained, using alcohol for cleansing and 1% Lidocaine with epinephrine for anesthetic, with sterile technique shave biopsy was used to obtain a biopsy specimen of the lesion. Hemostasis was obtained by pressure and silver nitrate. Antibiotic dressing is applied, and wound care instructions provided. Be alert for any signs of cutaneous infection. The specimen is labeled and sent to pathology for evaluation. The procedure was well tolerated without complications.   Assessment & Plan:   Atypical nevus Given acute change in size and color, further evaluation is warranted. Shave biopsy performed as above.  - Wound care instructions provided - Will call patient with results  - Return precautions discussed - RTC prn   Mina Marble, DO PGY-2, [facility] 07/20/2019 5:01 PM

## 2019-07-20 NOTE — Assessment & Plan Note (Signed)
Given acute change in size and color, further evaluation is warranted. Shave biopsy performed as above.  - Wound care instructions provided - Will call patient with results  - Return precautions discussed - RTC prn

## 2019-07-20 NOTE — Patient Instructions (Signed)
Thank you so much for coming in to see me today.  We took a small biopsy of your mole. I will call you with the results. Please clean wound daily with soap and water and apply Vaseline and band-aid. It should heal within 1-2 weeks. Please call or return to care if the area becomes very red, painful, warm, or if you develop fevers/chills.   Take care, Dr. Tarry Kos   Skin Biopsy, Care After This sheet gives you information about how to care for yourself after your procedure. Your health care provider may also give you more specific instructions. If you have problems or questions, contact your health care provider. What can I expect after the procedure? After the procedure, it is common to have:  Soreness.  Bruising.  Itching. Follow these instructions at home: Biopsy site care Follow instructions from your health care provider about how to take care of your biopsy site. Make sure you:  Wash your hands with soap and water before and after you change your bandage (dressing). If soap and water are not available, use hand sanitizer.  Apply ointment on your biopsy site as directed by your health care provider.  Change your dressing as told by your health care provider.  Leave stitches (sutures), skin glue, or adhesive strips in place. These skin closures may need to stay in place for 2 weeks or longer. If adhesive strip edges start to loosen and curl up, you may trim the loose edges. Do not remove adhesive strips completely unless your health care provider tells you to do that.  If the biopsy area bleeds, apply gentle pressure for 10 minutes. Check your biopsy site every day for signs of infection. Check for:  Redness, swelling, or pain.  Fluid or blood.  Warmth.  Pus or a bad smell.  General instructions  Rest and then return to your normal activities as told by your health care provider.  Take over-the-counter and prescription medicines only as told by your health care provider.   Keep all follow-up visits as told by your health care provider. This is important. Contact a health care provider if:  You have redness, swelling, or pain around your biopsy site.  You have fluid or blood coming from your biopsy site.  Your biopsy site feels warm to the touch.  You have pus or a bad smell coming from your biopsy site.  You have a fever.  Your sutures, skin glue, or adhesive strips loosen or come off sooner than expected. Get help right away if:  You have bleeding that does not stop with pressure or a dressing. Summary  After the procedure, it is common to have soreness, bruising, and itching at the site.  Follow instructions from your health care provider about how to take care of your biopsy site.  Check your biopsy site every day for signs of infection.  Contact a health care provider if you have redness, swelling, or pain around your biopsy site, or your biopsy site feels warm to the touch.  Keep all follow-up visits as told by your health care provider. This is important. This information is not intended to replace advice given to you by your health care provider. Make sure you discuss any questions you have with your health care provider. Document Released: 09/20/2015 Document Revised: 02/21/2018 Document Reviewed: 02/21/2018 Elsevier Patient Education  2020 [employer].

## 2019-07-28 ENCOUNTER — Telehealth: Payer: Self-pay | Admitting: Family Medicine

## 2019-07-28 NOTE — Telephone Encounter (Signed)
Spoke to patient. Provided skin biopsy results which included melanocytic nevus without atypia.  Patient had no questions or concerns.

## 2019-12-19 ENCOUNTER — Other Ambulatory Visit: Payer: Self-pay

## 2019-12-19 ENCOUNTER — Ambulatory Visit (INDEPENDENT_AMBULATORY_CARE_PROVIDER_SITE_OTHER): Payer: 59 | Admitting: Family Medicine

## 2019-12-19 VITALS — BP 98/62 | HR 84 | Ht 62.0 in | Wt 122.8 lb

## 2019-12-19 DIAGNOSIS — Z13220 Encounter for screening for lipoid disorders: Secondary | ICD-10-CM

## 2019-12-19 DIAGNOSIS — K219 Gastro-esophageal reflux disease without esophagitis: Secondary | ICD-10-CM | POA: Diagnosis not present

## 2019-12-19 DIAGNOSIS — Z1231 Encounter for screening mammogram for malignant neoplasm of breast: Secondary | ICD-10-CM | POA: Diagnosis not present

## 2019-12-19 DIAGNOSIS — R42 Dizziness and giddiness: Secondary | ICD-10-CM

## 2019-12-19 NOTE — Progress Notes (Signed)
Date of Visit: 12/19/2019   HPI:  Patient presents today for a well woman exam. [place] interpreter utilized during this visit.  Concerns today: occasional dizziness Periods: occasional spotting, has IUD Contraception: IUD in place since Jan 2015, has upcoming appointment with GYN in May to remove & replace STD Screening: declines today Pap smear status: current Exercise: no regular exercise, stays active at work Diet: tries to eat healthy, vegetarian Smoking: no Alcohol: no Drugs: no Mood: doing well, no concerns Dentist: does not go regularly, advised twice yearly visits  Dizziness - having episodes of dizziness a couple times a week, lasts just a few seconds at a time then goes away. Seems unrelated to position changes. Denies syncope, chest pain, or shortness of breath. Describes dizziness as spinning sensation. She is able to proceed with her usual activities, not that bothersome.  GERD - taking omeprazole 20mg  daily as needed. Tolerating well. Symptoms well controlled with this medication.   ROS: See HPI  [place]:  Cancers in family: mom cervical cancer, dad had liver cancer (drank lots of alcohol), grandfather with tongue cancer, grandmother with nose cancer (unsure if grandmother or grandfather smoked)  PHYSICAL EXAM: BP 98/62   Pulse 84   Ht 5\' 2"  (1.575 m)   Wt 122 lb 12.8 oz (55.7 kg)   SpO2 99%   BMI 22.46 kg/m  Gen: NAD, pleasant, cooperative HEENT: NCAT, PERRL, no palpable thyromegaly or anterior cervical lymphadenopathy Heart: RRR, no murmurs Lungs: CTAB, NWOB Abdomen: soft, nontender to palpation Neuro: grossly nonfocal, speech normal  ASSESSMENT/PLAN:  Health maintenance:  -STD screening: declines today -pap smear: current -mammogram: ordered today, handout given on how to schedule -lipid screening: check lipids today -colon cancer screening: n/a -lung cancer screening: n/a -DEXA: n/a -immunizations: UTD. Declines COVID vaccine at this time,  encouraged to get it. States she cannot afford to miss work right now if she feels ill after the vaccine. -handout given on health maintenance topics  GERD (gastroesophageal reflux disease) Well controlled. Continue current medication regimen.   Dizziness Seems overall benign. Normal exam today. Check labs to rule out obvious causes of dizziness (CMET, CBC, TSH). Per patient, not that bothersome.   FOLLOW UP: Follow up in 1 year for CPE  [place] J. Ardelia Mems, [place]

## 2019-12-19 NOTE — Patient Instructions (Signed)
Labs today Follow up in 1 year for next visit Be well, Dr. Ardelia Mems    Health Maintenance, Female Adopting a healthy lifestyle and getting preventive care are important in promoting health and wellness. Ask your health care provider about:  The right schedule for you to have regular tests and exams.  Things you can do on your own to prevent diseases and keep yourself healthy. What should I know about diet, weight, and exercise? Eat a healthy diet   Eat a diet that includes plenty of vegetables, fruits, low-fat dairy products, and lean protein.  Do not eat a lot of foods that are high in solid fats, added sugars, or sodium. Maintain a healthy weight Body mass index (BMI) is used to identify weight problems. It estimates body fat based on height and weight. Your health care provider can help determine your BMI and help you achieve or maintain a healthy weight. Get regular exercise Get regular exercise. This is one of the most important things you can do for your health. Most adults should:  Exercise for at least 150 minutes each week. The exercise should increase your heart rate and make you sweat (moderate-intensity exercise).  Do strengthening exercises at least twice a week. This is in addition to the moderate-intensity exercise.  Spend less time sitting. Even light physical activity can be beneficial. Watch cholesterol and blood lipids Have your blood tested for lipids and cholesterol at 47 years of age, then have this test every 5 years. Have your cholesterol levels checked more often if:  Your lipid or cholesterol levels are high.  You are older than 47 years of age.  You are at high risk for heart disease. What should I know about cancer screening? Depending on your health history and family history, you may need to have cancer screening at various ages. This may include screening for:  Breast cancer.  Cervical cancer.  Colorectal cancer.  Skin cancer.  Lung  cancer. What should I know about heart disease, diabetes, and high blood pressure? Blood pressure and heart disease  High blood pressure causes heart disease and increases the risk of stroke. This is more likely to develop in people who have high blood pressure readings, are of African descent, or are overweight.  Have your blood pressure checked: ? Every 3-5 years if you are 69-36 years of age. ? Every year if you are 84 years old or older. Diabetes Have regular diabetes screenings. This checks your fasting blood sugar level. Have the screening done:  Once every three years after age 25 if you are at a normal weight and have a low risk for diabetes.  More often and at a younger age if you are overweight or have a high risk for diabetes. What should I know about preventing infection? Hepatitis B If you have a higher risk for hepatitis B, you should be screened for this virus. Talk with your health care provider to find out if you are at risk for hepatitis B infection. Hepatitis C Testing is recommended for:  Everyone born from [birth year] through 1963.  Anyone with known risk factors for hepatitis C. Sexually transmitted infections (STIs)  Get screened for STIs, including gonorrhea and chlamydia, if: ? You are sexually active and are younger than 47 years of age. ? You are older than 47 years of age and your health care provider tells you that you are at risk for this type of infection. ? Your sexual activity has changed since you were last  screened, and you are at increased risk for chlamydia or gonorrhea. Ask your health care provider if you are at risk.  Ask your health care provider about whether you are at high risk for HIV. Your health care provider may recommend a prescription medicine to help prevent HIV infection. If you choose to take medicine to prevent HIV, you should first get tested for HIV. You should then be tested every 3 months for as long as you are taking the  medicine. Pregnancy  If you are about to stop having your period (premenopausal) and you may become pregnant, seek counseling before you get pregnant.  Take 400 to 800 micrograms (mcg) of folic acid every day if you become pregnant.  Ask for birth control (contraception) if you want to prevent pregnancy. Osteoporosis and menopause Osteoporosis is a disease in which the bones lose minerals and strength with aging. This can result in bone fractures. If you are 74 years old or older, or if you are at risk for osteoporosis and fractures, ask your health care provider if you should:  Be screened for bone loss.  Take a calcium or vitamin D supplement to lower your risk of fractures.  Be given hormone replacement therapy (HRT) to treat symptoms of menopause. Follow these instructions at home: Lifestyle  Do not use any products that contain nicotine or tobacco, such as cigarettes, e-cigarettes, and chewing tobacco. If you need help quitting, ask your health care provider.  Do not use street drugs.  Do not share needles.  Ask your health care provider for help if you need support or information about quitting drugs. Alcohol use  Do not drink alcohol if: ? Your health care provider tells you not to drink. ? You are pregnant, may be pregnant, or are planning to become pregnant.  If you drink alcohol: ? Limit how much you use to 0-1 drink a day. ? Limit intake if you are breastfeeding.  Be aware of how much alcohol is in your drink. In the [place] one drink equals one 12 oz bottle of beer (355 mL), one 5 oz glass of wine (148 mL), or one 1 oz glass of hard liquor (44 mL). General instructions  Schedule regular health, dental, and eye exams.  Stay current with your vaccines.  Tell your health care provider if: ? You often feel depressed. ? You have ever been abused or do not feel safe at home. Summary  Adopting a healthy lifestyle and getting preventive care are important in  promoting health and wellness.  Follow your health care provider's instructions about healthy diet, exercising, and getting tested or screened for diseases.  Follow your health care provider's instructions on monitoring your cholesterol and blood pressure. This information is not intended to replace advice given to you by your health care provider. Make sure you discuss any questions you have with your health care provider. Document Revised: 08/17/2018 Document Reviewed: 08/17/2018 Elsevier Patient Education  2020 [employer].

## 2019-12-20 LAB — LIPID PANEL
Chol/HDL Ratio: 3.6 ratio (ref 0.0–4.4)
Cholesterol, Total: 164 mg/dL (ref 100–199)
HDL: 46 mg/dL (ref >39)
LDL Chol Calc (NIH): 104 mg/dL — ABNORMAL HIGH (ref 0–99)
Triglycerides: 74 mg/dL (ref 0–149)
VLDL Cholesterol Cal: 14 mg/dL (ref 5–40)

## 2019-12-20 LAB — CBC
Hematocrit: 40.6 % (ref 34.0–46.6)
Hemoglobin: 13.5 g/dL (ref 11.1–15.9)
MCH: 28.9 pg (ref 26.6–33.0)
MCHC: 33.3 g/dL (ref 31.5–35.7)
MCV: 87 fL (ref 79–97)
Platelets: 260 10*3/uL (ref 150–450)
RBC: 4.67 x10E6/uL (ref 3.77–5.28)
RDW: 11.8 % (ref 11.7–15.4)
WBC: 9.1 10*3/uL (ref 3.4–10.8)

## 2019-12-20 LAB — CMP14+EGFR
ALT: 17 IU/L (ref 0–32)
AST: 17 IU/L (ref 0–40)
Albumin/Globulin Ratio: 1.6 (ref 1.2–2.2)
Albumin: 4.5 g/dL (ref 3.8–4.8)
Alkaline Phosphatase: 94 IU/L (ref 39–117)
BUN/Creatinine Ratio: 15 (ref 9–23)
BUN: 9 mg/dL (ref 6–24)
Bilirubin Total: 0.5 mg/dL (ref 0.0–1.2)
CO2: 21 mmol/L (ref 20–29)
Calcium: 9 mg/dL (ref 8.7–10.2)
Chloride: 104 mmol/L (ref 96–106)
Creatinine, Ser: 0.61 mg/dL (ref 0.57–1.00)
GFR calc Af Amer: 125 mL/min/{1.73_m2} (ref >59)
GFR calc non Af Amer: 108 mL/min/{1.73_m2} (ref >59)
Globulin, Total: 2.9 g/dL (ref 1.5–4.5)
Glucose: 84 mg/dL (ref 65–99)
Potassium: 4.1 mmol/L (ref 3.5–5.2)
Sodium: 139 mmol/L (ref 134–144)
Total Protein: 7.4 g/dL (ref 6.0–8.5)

## 2019-12-20 LAB — TSH: TSH: 0.789 u[IU]/mL (ref 0.450–4.500)

## 2019-12-23 NOTE — Assessment & Plan Note (Signed)
Well controlled. Continue current medication regimen.  

## 2019-12-23 NOTE — Assessment & Plan Note (Addendum)
Seems overall benign. Normal exam today. Check labs to rule out obvious causes of dizziness (CMET, CBC, TSH). Per patient, not that bothersome.

## 2019-12-25 ENCOUNTER — Encounter: Payer: Self-pay | Admitting: Family Medicine

## 2020-01-09 ENCOUNTER — Other Ambulatory Visit: Payer: Self-pay

## 2020-01-09 ENCOUNTER — Ambulatory Visit
Admission: RE | Admit: 2020-01-09 | Discharge: 2020-01-09 | Disposition: A | Discharge status: Home / Self Care | Payer: 59 | Source: Ambulatory Visit | Attending: Family Medicine | Admitting: Family Medicine

## 2020-01-09 DIAGNOSIS — Z1231 Encounter for screening mammogram for malignant neoplasm of breast: Secondary | ICD-10-CM

## 2020-01-24 LAB — HM PAP SMEAR: HM Pap smear: NEGATIVE

## 2020-01-24 LAB — RESULTS CONSOLE HPV: CHL HPV: NEGATIVE

## 2020-03-08 ENCOUNTER — Encounter: Payer: Self-pay | Admitting: Family Medicine

## 2020-08-25 ENCOUNTER — Ambulatory Visit (HOSPITAL_COMMUNITY)
Admission: EM | Admit: 2020-08-25 | Discharge: 2020-08-25 | Disposition: A | Discharge status: Home / Self Care | Payer: 59 | Attending: Emergency Medicine | Admitting: Emergency Medicine

## 2020-08-25 ENCOUNTER — Encounter (HOSPITAL_COMMUNITY): Payer: Self-pay | Admitting: *Deleted

## 2020-08-25 ENCOUNTER — Other Ambulatory Visit: Payer: Self-pay

## 2020-08-25 DIAGNOSIS — R42 Dizziness and giddiness: Secondary | ICD-10-CM | POA: Diagnosis not present

## 2020-08-25 MED ORDER — MECLIZINE HCL 25 MG PO TABS: 25.0000 mg | ORAL_TABLET | Freq: Three times a day (TID) | ORAL | 0 refills | Status: DC | PRN

## 2020-08-25 NOTE — ED Provider Notes (Signed)
HPI  SUBJECTIVE:  Savannah Banks is a 49 y.o. female who presents with dizziness today described as being like "I am drunk."  She describes vertigo.  Lasted several hours, but is getting better.  She states that this has worse with turning her head rapidly, better with rest.  She also tried tea.  She states that she has eaten normally today.  She denies nausea, chest pain, shortness of breath, palpitations, headache, visual changes, leg weakness, facial droop, syncope, ear pain, tinnitus, recent viral illness, nasal congestion, sinus pain or pressure, head trauma.  No vomiting, diarrhea, change in urine output, vaginal bleeding, melena, hematochezia.  She does not take any medications on a regular basis.  She states this is identical to the previous episode of dizziness which was thought to be peripheral vertigo in 2016 that got better with meclizine.  Past medical history of vertigo.  No history of stroke, diabetes, hypertension.  PMD: Leeanne Rio, MD   History reviewed. No pertinent past medical history.  Past Surgical History:  Procedure Laterality Date  . BREAST BIOPSY Left pt unsure   benign  . TONSILLECTOMY      Family History  Problem Relation Age of Onset  . Diabetes Mother   . Hypertension Mother   . Liver cancer Father     Social History   Tobacco Use  . Smoking status: Never Smoker  . Smokeless tobacco: Never Used  Substance Use Topics  . Alcohol use: No  . Drug use: No    No current facility-administered medications for this encounter.  Current Outpatient Medications:  .  meclizine (ANTIVERT) 25 MG tablet, Take 1 tablet (25 mg total) by mouth 3 (three) times daily as needed for dizziness., Disp: 30 tablet, Rfl: 0  Allergies  Allergen Reactions  . Shellfish Allergy Rash     ROS  As noted in HPI.   Physical Exam  BP 117/68 (BP Location: Right Arm)   Pulse 80   Temp 98.4 F (36.9 C) (Oral)   Resp 18   SpO2 100%   Constitutional: Well developed,  well nourished, no acute distress Eyes:  EOMI, conjunctiva normal bilaterally PERRLA, no photophobia. HENT: Normocephalic, atraumatic,mucus membranes moist.  TMs normal bilaterally Respiratory: Normal inspiratory effort Cardiovascular: Normal rate, regular rhythm no murmurs, rubs, gallops.  No carotid bruit. GI: nondistended skin: No rash, skin intact Musculoskeletal: no deformities Neurologic: Alert & oriented x 3, cranial nerves III through XII intact.  No facial droop.  Speech fluent.  Finger-nose, heel shin within normal limits.  Tandem gait steady.  Romberg negative.  Positive Dix-Hallpike on the right. Psychiatric: Speech and behavior appropriate   ED Course   Medications - No data to display  No orders of the defined types were placed in this encounter.   No results found for this or any previous visit (from the past 24 hour(s)). No results found.  ED Clinical Impression  1. Vertigo      ED Assessment/Plan  Presentation consistent with a peripheral vertigo.  She states the meclizine helped her in the past and that this dizziness is identical to the dizziness that she was seen for here in 2016.  Will send home with meclizine, Epley maneuver on the right.  Follow-up with PMD if not getting better in several days.  To the ER if she gets worse, for any strokelike symptoms, or for other concerns.   Discussed \\MDM , treatment plan, and plan for follow-up with patient. Discussed sn/sx that should prompt return  to the ED. patient agrees with plan.   Meds ordered this encounter  Medications  . meclizine (ANTIVERT) 25 MG tablet    Sig: Take 1 tablet (25 mg total) by mouth 3 (three) times daily as needed for dizziness.    Dispense:  30 tablet    Refill:  0    *This clinic note was created using [employer]. Therefore, there may be occasional mistakes despite careful proofreading.   ?    Melynda Ripple, MD 08/26/20 [phone]

## 2020-08-25 NOTE — ED Triage Notes (Signed)
Pt reports while at work today she felt dizzy and walked like she was drunk. Pt reports she sat down at work but still feels a little dizzy. Pt A/O x4

## 2020-08-25 NOTE — Discharge Instructions (Addendum)
Take the meclizine as directed.  Push plenty of fluids.  Do not turn your head rapidly.  Try the Epley maneuver on the right.  See the instruction sheet that I am giving you.

## 2020-09-09 ENCOUNTER — Other Ambulatory Visit: Payer: Self-pay

## 2020-09-09 ENCOUNTER — Ambulatory Visit (HOSPITAL_COMMUNITY)
Admission: EM | Admit: 2020-09-09 | Discharge: 2020-09-09 | Disposition: A | Discharge status: Home / Self Care | Payer: 59 | Attending: Urgent Care | Admitting: Urgent Care

## 2020-09-09 DIAGNOSIS — R42 Dizziness and giddiness: Secondary | ICD-10-CM | POA: Diagnosis not present

## 2020-09-09 MED ORDER — MECLIZINE HCL 25 MG PO TABS: 25.0000 mg | ORAL_TABLET | Freq: Three times a day (TID) | ORAL | 0 refills | Status: DC | PRN

## 2020-09-09 NOTE — ED Triage Notes (Signed)
Pt was seen at [facility] on 08-25-20 for feeling dizzy. Pt was seen and started on meds. Pt reports she is now out of medication and needs a refill.

## 2020-09-09 NOTE — ED Provider Notes (Signed)
[place]   MRN: [id number] DOB: 1971/01/03  Subjective:   Savannah Banks is a 50 y.o. female presenting for refill of the meclizine.  Last office visit for the same was 08/25/2020.  Patient states that the meclizine has helped with her dizziness but she still has intermittent dizziness, vertigo.  Symptoms can last minutes to hours but usually resolve with use of meclizine.  Patient states that she has had a history of this in the past, last episode was a few years ago and resolved on its own with supportive care.  She did get a full panel of labs in April and all were very normal.  Denies fever, headache, vision change, ear drainage, tinnitus, sore throat, cough, chest pain, shortness of breath, belly pain.  Denies taking medications on a regular basis.  She is not a smoker.  No current facility-administered medications for this encounter.  Current Outpatient Medications:  .  meclizine (ANTIVERT) 25 MG tablet, Take 1 tablet (25 mg total) by mouth 3 (three) times daily as needed for dizziness., Disp: 30 tablet, Rfl: 0   Allergies  Allergen Reactions  . Shellfish Allergy Rash    No past medical history on file.   Past Surgical History:  Procedure Laterality Date  . BREAST BIOPSY Left pt unsure   benign  . TONSILLECTOMY      Family History  Problem Relation Age of Onset  . Diabetes Mother   . Hypertension Mother   . Liver cancer Father     Social History   Tobacco Use  . Smoking status: Never Smoker  . Smokeless tobacco: Never Used  Substance Use Topics  . Alcohol use: No  . Drug use: No    ROS   Objective:   Vitals: BP 106/77 (BP Location: Left Arm)   Pulse 77   Temp 98.3 F (36.8 C) (Oral)   Resp 16   LMP 08/09/2020   SpO2 98%   Physical Exam Constitutional:      General: She is not in acute distress.    Appearance: Normal appearance. She is well-developed. She is not ill-appearing, toxic-appearing or diaphoretic.  HENT:     Head:  Normocephalic and atraumatic.     Right Ear: Tympanic membrane and ear canal normal. No drainage or tenderness. No middle ear effusion. Tympanic membrane is not erythematous.     Left Ear: Tympanic membrane and ear canal normal. No drainage or tenderness.  No middle ear effusion. Tympanic membrane is not erythematous.     Nose: Nose normal. No congestion or rhinorrhea.     Mouth/Throat:     Mouth: Mucous membranes are moist. No oral lesions.     Pharynx: Oropharynx is clear. No pharyngeal swelling, oropharyngeal exudate, posterior oropharyngeal erythema or uvula swelling.     Tonsils: No tonsillar exudate or tonsillar abscesses.  Eyes:     Extraocular Movements: Extraocular movements intact.     Right eye: Normal extraocular motion.     Left eye: Normal extraocular motion.     Conjunctiva/sclera: Conjunctivae normal.     Pupils: Pupils are equal, round, and reactive to light.  Cardiovascular:     Rate and Rhythm: Normal rate and regular rhythm.     Pulses: Normal pulses.     Heart sounds: Normal heart sounds. No murmur heard. No friction rub. No gallop.   Pulmonary:     Effort: Pulmonary effort is normal. No respiratory distress.     Breath sounds: Normal breath sounds.  No stridor. No wheezing, rhonchi or rales.  Musculoskeletal:     Cervical back: Normal range of motion and neck supple.  Lymphadenopathy:     Cervical: No cervical adenopathy.  Skin:    General: Skin is warm and dry.     Findings: No rash.  Neurological:     General: No focal deficit present.     Mental Status: She is alert and oriented to person, place, and time.     Cranial Nerves: No cranial nerve deficit.     Motor: No weakness.     Coordination: Coordination normal.     Gait: Gait normal.     Deep Tendon Reflexes: Reflexes normal.  Psychiatric:        Mood and Affect: Mood normal.        Behavior: Behavior normal.        Thought Content: Thought content normal.        Judgment: Judgment normal.       Assessment and Plan :   PDMP not reviewed this encounter.  1. Vertigo     Refilled meclizine, recommended follow-up with ENT.  Also establish care with a new PCP, information provided to the patient for this. Counseled patient on potential for adverse effects with medications prescribed/recommended today, ER and return-to-clinic precautions discussed, patient verbalized understanding.    Wallis Bamberg, PA-C 09/09/20 [id number]

## 2020-09-09 NOTE — Discharge Instructions (Addendum)
Please make sure that you follow-up with [facility] for your persistent dizziness, vertigo.  In the meantime, you can use meclizine as needed.  I recommend you establish care with a new family doctor with either Dr. Neva Seat or PA Leotis Shames.

## 2020-09-10 ENCOUNTER — Other Ambulatory Visit: Payer: Self-pay

## 2020-09-10 ENCOUNTER — Encounter: Payer: Self-pay | Admitting: Family Medicine

## 2020-09-10 ENCOUNTER — Ambulatory Visit (INDEPENDENT_AMBULATORY_CARE_PROVIDER_SITE_OTHER): Payer: 59 | Admitting: Family Medicine

## 2020-09-10 VITALS — BP 110/76 | HR 84 | Ht 62.0 in | Wt 132.6 lb

## 2020-09-10 DIAGNOSIS — R42 Dizziness and giddiness: Secondary | ICD-10-CM | POA: Diagnosis not present

## 2020-09-10 MED ORDER — MECLIZINE HCL 25 MG PO TABS: 25.0000 mg | ORAL_TABLET | Freq: Three times a day (TID) | ORAL | 0 refills | Status: DC | PRN

## 2020-09-10 NOTE — Patient Instructions (Addendum)
Referring to physical therapy for your dizziness Follow up if not improving Refilled meclizine  Recommend you get the COVID vaccine. Please call if you have questions about it.   Chng m?t Vertigo Chng m?t l m?t c?m gic qu v? ho?c nh?ng v?t xung quanh qu v? chuy?n ??ng trong khi th?c t? khng ph?i nh? v?y. C?m gic ny c th? ??n v ?i b?t c? lc no. Chng m?t th??ng t? kh?i. Chng m?t c th? nguy hi?m n?u n x?y ra khi qu v? ?ang lm g ? c th? gy nguy hi?m cho b?n thn ho?c cho ng??i khc, ch?ng h?n nh? li xe ho?c v?n hnh my mc h?ng n?ng. Chuyn gia ch?m [place] s?c kh?e s? lm cc xt nghi?m ?? xc ??nh nguyn nhn gy chng m?t c?a qu v?. Cc xt nghi?m c?ng s? gip chuyn gia ch?m [place] s?c kh?e quy?t ??nh cch t?t nh?t ?? ?i?u tr? tnh tr?ng c?a qu v?. Tun th? nh?ng h??ng d?n ny ? nh: ?n v u?ng      U?ng ?? n??c ?? gi? cho n??c ti?u c mu vng nh?t.  Khng u?ng r??u. Ho?t ??ng  Tr? l?i sinh ho?t bnh th??ng theo ch? d?n c?a chuyn gia ch?m [place] s?c kh?e. Hy h?i chuyn gia ch?m [place] s?c kh?e v? cc ho?t ??ng no l an ton cho qu v?.  Vo bu?i sng, ??u tin l ng?i d?y trn mp gi??ng. Khi qu v? c?m th?y ?n, hy t? t? ??ng d?y trong lc bm vo v?t g ? cho ??n khi qu v? th?y th?ng b?ng.  C? ??ng ch?m. [place] c? ??ng c? th? v ??u ??t ng?t ho?c m?t s? t? th? nh?t ??nh theo nh? h??ng d?n c?a chuyn gia ch?m [place] s?c kh?e.  N?u qu v? kh ?i l?i ho?c kh gi? th?ng b?ng, th? dng m?t cy g?y ?? cn b?ng. N?u qu v? c?m th?y chng m?t ho?c khng cn b?ng, hy ng?i xu?ng ngay.  [place] lm b?t k? vi?c g gy nguy hi?m cho qu v? ho?c ng??i khc n?u chng m?t x?y ra.  Trnh g?p ng??i xu?ng n?u qu v? c?m th?y chng m?t. ??t cc ?? v?t trong nh sao cho d? v?i t?i m khng c?n ph?i nhoi ng??i.  Khng li xe ho?c v?n hnh my mc n?ng n?u qu v? c?m th?y chng m?t. H??ng d?n chung  Ch? s? d?ng thu?c khng k ??n v thu?c k ??n theo ch? d?n c?a chuyn gia ch?m [place]  s?c kh?e.  Tun th? t?t c? cc l?n khm theo di theo ch? d?n c?a chuyn gia ch?m [place] s?c kh?e. ?i?u ny c vai tr quan tr?ng. Hy lin l?c v?i chuyn gia ch?m [place] s?c kh?e n?u:  Thu?c khng lm gi?m tnh tr?ng chng m?c ho?c lm tnh tr?ng tr?m tr?ng h?n.  Qu v? b? s?t.  Tnh tr?ng c?a qu v? tr?m tr?ng h?n ho?c qu v? c nh?ng tri?u ch?ng m?i.  Gia ?nh v b?n b qu v? nh?n th?y qu v? c nh?ng thay ??i v? hnh vi.  Tnh tr?ng bu?n nn v nn m?a tr?m tr?ng h?n.  Qu v? b? t b ho?c c?m gic r?n r?n nh? ki?n b ho?c ng?a ran ? m?t b? ph?n c? th?. Yu c?u tr? gip ngay l?p t?c n?u qu v?:  B? kh c? ??ng ho?c kh ni.  Lun b? chng m?t.  Ng?t x?u.  B? ?au ??u r?t nhi?u.  B? y?u ? bn tay, cnh tay  ho?c chn.  B? thay ??i thnh l?c ho?c th? l?c.  B? c?ng c?.  B? nh?y c?m v?i nh sng. Tm t?t  Chng m?t l m?t c?m gic qu v? ho?c nh?ng v?t xung quanh qu v? chuy?n ??ng trong khi th?c t? khng ph?i nh? v?y.  Chuyn gia ch?m [place] s?c kh?e s? lm cc xt nghi?m ?? xc ??nh nguyn nhn gy chng m?t c?a qu v?.  Tun th? cc h??ng d?n ch?m [place] t?i nh. Qu v? c th? ???c yu c?u trnh nh?ng cng vi?c, t? th? ho?c c? ??ng nh?t ??nh.  Lin l?c v?i chuyn gia ch?m [place] s?c kh?e n?u thu?c c?a qu v? khng lm gi?m nh? cc tri?u ch?ng, ho?c n?u qu v? b? s?t, bu?n nn, nn ho?c thay ??i hnh vi.  Tm s? tr? gip ngay l?p t?c n?u qu v? b? ?au ??u d? d?i ho?c kh ni, ho?c qu v? b? cc v?n ?? v? thnh l?c ho?c th? l?c. Thng tin ny khng nh?m m?c ?ch thay th? cho l?i khuyn m chuyn gia ch?m [place] s?c kh?e ni v?i qu v?. Hy b?o ??m qu v? ph?i th?o lu?n b?t k? v?n ?? g m qu v? c v?i chuyn gia ch?m [place] s?c kh?e c?a qu v?. Document Revised: 08/17/2018 Document Reviewed: 08/17/2018 Elsevier Patient Education  2020 [employer].

## 2020-09-10 NOTE — Progress Notes (Signed)
  Date of Visit: 09/10/2020   SUBJECTIVE:   HPI:  Savannah Banks presents today for vertigo. [place] interpreter utilized during this visit.   Dizziness - seen at [facility] on 12/19 for dizziness, then again on 1/3. Given rx for meclizine which helps a lot with symptoms. Dizziness consists of spinning/off balance sensation. Worse when she changes positions (eg. Sits up) and worse when she turns her head quickly. Meclizine helps a lot. Sensation of spinning comes and goes. She was advised at [facility] to follow up with PCP. Had a positive Dix-Hallpike at original UC visit.  OBJECTIVE:   BP 110/76   Pulse 84   Ht 5\' 2"  (1.575 m)   Wt 132 lb 9.6 oz (60.1 kg)   SpO2 99%   BMI 24.25 kg/m  Gen: no acute distress, pleasant, cooperative HEENT: normocephalic, atraumatic  Heart: regular rate and rhythm, no murmur Lungs: clear to auscultation bilaterally, normal work of breathing  Neuro: cranial nerves II-XII tested and intact. Speech normal. Full strength bilat upper and lower ext. Sensation intact over bilateral upper and lower extremities. Normal FNF. No nystagmus appreciated with extraocular movements. +dizziness but no nystagmus seen (exam challenging) with R sided Ext: No appreciable lower extremity edema bilaterally   ASSESSMENT/PLAN:   Health maintenance:  -current on flu vaccine -strongly encouraged patient to receive COVID vaccine; she states she is not ready for this. Counseled on benefits. -discuss colon cancer screening (new guidelines suggest starting at age 44) at future visit as we did not have time to do this today.  Dizziness Believe likely due to BPPV, given improvement with meclizine and positional, intermittent nature. Neuro exam nonfocal today. Will refer for physical therapy vestibular rehab. Follow up if not improving with this. At that time would consider imaging of head given prolonged nature of symptoms. Refilled meclizine.  Also checking labs today at patient's request -  she wants to make sure nothing else is causing her symptoms.   FOLLOW UP: Follow up if not improving with physical therapy vestib rehab.  [address]. [place], MD [facility]  Greater than 30 minutes were spent on this encounter on the day of service, including pre-visit planning, actual face to face time, coordination of care, and documentation of visit.

## 2020-09-11 ENCOUNTER — Encounter: Payer: Self-pay | Admitting: Family Medicine

## 2020-09-11 LAB — CBC
Hematocrit: 42 % (ref 34.0–46.6)
Hemoglobin: 13.5 g/dL (ref 11.1–15.9)
MCH: 27.9 pg (ref 26.6–33.0)
MCHC: 32.1 g/dL (ref 31.5–35.7)
MCV: 87 fL (ref 79–97)
Platelets: 252 10*3/uL (ref 150–450)
RBC: 4.84 x10E6/uL (ref 3.77–5.28)
RDW: 12 % (ref 11.7–15.4)
WBC: 8.9 10*3/uL (ref 3.4–10.8)

## 2020-09-11 LAB — BASIC METABOLIC PANEL
BUN/Creatinine Ratio: 22 (ref 9–23)
BUN: 15 mg/dL (ref 6–24)
CO2: 24 mmol/L (ref 20–29)
Calcium: 9.2 mg/dL (ref 8.7–10.2)
Chloride: 102 mmol/L (ref 96–106)
Creatinine, Ser: 0.67 mg/dL (ref 0.57–1.00)
GFR calc Af Amer: 121 mL/min/{1.73_m2} (ref >59)
GFR calc non Af Amer: 105 mL/min/{1.73_m2} (ref >59)
Glucose: 97 mg/dL (ref 65–99)
Potassium: 4 mmol/L (ref 3.5–5.2)
Sodium: 138 mmol/L (ref 134–144)

## 2020-09-11 NOTE — Assessment & Plan Note (Addendum)
Believe likely due to BPPV, given improvement with meclizine and positional, intermittent nature. Neuro exam nonfocal today. Will refer for physical therapy vestibular rehab. Follow up if not improving with this. At that time would consider imaging of head given prolonged nature of symptoms. Refilled meclizine.  Also checking labs today at patient's request - she wants to make sure nothing else is causing her symptoms.

## 2020-09-23 ENCOUNTER — Encounter (HOSPITAL_COMMUNITY): Payer: Self-pay | Admitting: Emergency Medicine

## 2020-09-23 ENCOUNTER — Other Ambulatory Visit: Payer: Self-pay

## 2020-09-23 ENCOUNTER — Ambulatory Visit (HOSPITAL_COMMUNITY)
Admission: EM | Admit: 2020-09-23 | Discharge: 2020-09-23 | Disposition: A | Discharge status: Home / Self Care | Payer: 59 | Attending: Urgent Care | Admitting: Urgent Care

## 2020-09-23 DIAGNOSIS — U071 COVID-19: Secondary | ICD-10-CM | POA: Insufficient documentation

## 2020-09-23 DIAGNOSIS — J029 Acute pharyngitis, unspecified: Secondary | ICD-10-CM | POA: Insufficient documentation

## 2020-09-23 DIAGNOSIS — R059 Cough, unspecified: Secondary | ICD-10-CM | POA: Insufficient documentation

## 2020-09-23 DIAGNOSIS — J069 Acute upper respiratory infection, unspecified: Secondary | ICD-10-CM | POA: Diagnosis not present

## 2020-09-23 DIAGNOSIS — R519 Headache, unspecified: Secondary | ICD-10-CM | POA: Diagnosis not present

## 2020-09-23 MED ORDER — PROMETHAZINE-DM 6.25-15 MG/5ML PO SYRP: 5.0000 mL | ORAL_SOLUTION | Freq: Every evening | ORAL | 0 refills | Status: DC | PRN

## 2020-09-23 MED ORDER — CETIRIZINE HCL 10 MG PO TABS: 10.0000 mg | ORAL_TABLET | Freq: Every day | ORAL | 0 refills | Status: DC

## 2020-09-23 MED ORDER — PREDNISONE 20 MG PO TABS: ORAL_TABLET | ORAL | 0 refills | Status: DC

## 2020-09-23 NOTE — Discharge Instructions (Signed)
Start prednisone tomorrow to help avoid side effect of insomnia, irritability.   We will notify you of your COVID-19 test results as they arrive and may take between 24 to 48 hours.  I encourage you to sign up for MyChart if you have not already done so as this can be the easiest way for [place] to communicate results to you online or through a phone app.  In the meantime, if you develop worsening symptoms including fever, chest pain, shortness of breath despite our current treatment plan then please report to the emergency room as this may be a sign of worsening status from possible COVID-19 infection.  Otherwise, we will manage this as a viral syndrome. For sore throat or cough try using a honey-based tea. Use 3 teaspoons of honey with juice squeezed from half lemon. Place shaved pieces of ginger into 1/2-1 cup of water and warm over stove top. Then mix the ingredients and repeat every 4 hours as needed. Please take Tylenol 500mg -650mg  every 6 hours for aches and pains, fevers. Hydrate very well with at least 2 liters of water. Eat light meals such as soups to replenish electrolytes and soft fruits, veggies. Start an antihistamine like Zyrtec, Allegra or Claritin for postnasal drainage, sinus congestion.

## 2020-09-23 NOTE — ED Triage Notes (Signed)
States that she has a sore throat, HA, body aches and a cough. Pt states that her sx started Saturday night.

## 2020-09-23 NOTE — ED Provider Notes (Signed)
[place]   MRN: [id number] DOB: September 29, 1970  Subjective:   Savannah Banks is a 49 y.o. female presenting for 3-day history of acute onset sinus headache, sinus congestion, sore throat, body aches, cough.  Patient states that the congestion was throat pain over the worse.  States that she feels like she gets sick like this at the same time each year.  She is not COVID vaccinated.  Denies chest pain, shortness of breath.  No current facility-administered medications for this encounter.  Current Outpatient Medications:  .  meclizine (ANTIVERT) 25 MG tablet, Take 1 tablet (25 mg total) by mouth 3 (three) times daily as needed for dizziness., Disp: 60 tablet, Rfl: 0   Allergies  Allergen Reactions  . Shellfish Allergy Rash    History reviewed. No pertinent past medical history.   Past Surgical History:  Procedure Laterality Date  . BREAST BIOPSY Left pt unsure   benign  . TONSILLECTOMY      Family History  Problem Relation Age of Onset  . Diabetes Mother   . Hypertension Mother   . Liver cancer Father     Social History   Tobacco Use  . Smoking status: Never Smoker  . Smokeless tobacco: Never Used  Substance Use Topics  . Alcohol use: No  . Drug use: No    ROS   Objective:   Vitals: BP 106/74 (BP Location: Right Arm)   Pulse 87   Temp 98.4 F (36.9 C) (Oral)   SpO2 96%   Physical Exam Constitutional:      General: She is not in acute distress.    Appearance: Normal appearance. She is well-developed. She is not ill-appearing, toxic-appearing or diaphoretic.  HENT:     Head: Normocephalic and atraumatic.     Nose: Congestion present.     Mouth/Throat:     Mouth: Mucous membranes are moist.     Pharynx: No pharyngeal swelling, oropharyngeal exudate, posterior oropharyngeal erythema or uvula swelling.     Comments: Significant postnasal drainage overlying pharynx. Eyes:     Extraocular Movements: Extraocular movements intact.     Pupils:  Pupils are equal, round, and reactive to light.  Cardiovascular:     Rate and Rhythm: Normal rate and regular rhythm.     Pulses: Normal pulses.     Heart sounds: Normal heart sounds. No murmur heard. No friction rub. No gallop.   Pulmonary:     Effort: Pulmonary effort is normal. No respiratory distress.     Breath sounds: Normal breath sounds. No stridor. No wheezing, rhonchi or rales.  Skin:    General: Skin is warm and dry.     Findings: No rash.  Neurological:     Mental Status: She is alert and oriented to person, place, and time.     Cranial Nerves: No cranial nerve deficit.     Motor: No weakness.     Coordination: Coordination normal.     Gait: Gait normal.     Deep Tendon Reflexes: Reflexes normal.  Psychiatric:        Mood and Affect: Mood normal.        Behavior: Behavior normal.        Thought Content: Thought content normal.        Judgment: Judgment normal.      Assessment and Plan :   PDMP not reviewed this encounter.  1. Viral upper respiratory tract infection   2. Cough   3. Sore throat  4. Sinus headache     We will use aggressive management given the patient states 10 out of 10 sinus headache, throat pain.  Do not see signs of acute intracranial process, vital signs are very stable and normal.  Eventually, patient agreed to COVID-19 testing.  Emphasized that this is important to know as well given that she is not vaccinated and especially in light of her current symptoms. Counseled patient on potential for adverse effects with medications prescribed/recommended today, ER and return-to-clinic precautions discussed, patient verbalized understanding.    Jaynee Eagles, [place] 09/23/20 1824

## 2020-09-24 DIAGNOSIS — U071 COVID-19: Secondary | ICD-10-CM

## 2020-09-24 HISTORY — DX: COVID-19: U07.1

## 2020-09-24 LAB — SARS CORONAVIRUS 2 (TAT 6-24 HRS): SARS Coronavirus 2: POSITIVE — AB

## 2020-09-25 ENCOUNTER — Telehealth: Payer: Self-pay | Admitting: *Deleted

## 2020-09-25 NOTE — Telephone Encounter (Signed)
Called to discuss with patient about COVID-19 symptoms and the use of one of the available treatments for those with mild to moderate Covid symptoms and at a high risk of hospitalization.  Pt appears to qualify for outpatient treatment due to co-morbid conditions and/or a member of an at-risk group in accordance with the FDA Emergency Use Authorization.    Symptom onset:  Vaccinated:  Booster?  Immunocompromised?  Qualifiers:   Unable to reach pt - Left VM via [place] [phone] to return call for information.   Tarry Kos

## 2020-10-09 ENCOUNTER — Other Ambulatory Visit: Payer: Self-pay

## 2020-10-09 ENCOUNTER — Encounter (HOSPITAL_COMMUNITY): Payer: Self-pay | Admitting: Emergency Medicine

## 2020-10-09 ENCOUNTER — Ambulatory Visit (HOSPITAL_COMMUNITY)
Admission: EM | Admit: 2020-10-09 | Discharge: 2020-10-09 | Disposition: A | Discharge status: Home / Self Care | Payer: 59 | Attending: Medical Oncology | Admitting: Medical Oncology

## 2020-10-09 DIAGNOSIS — R059 Cough, unspecified: Secondary | ICD-10-CM | POA: Diagnosis not present

## 2020-10-09 DIAGNOSIS — R519 Headache, unspecified: Secondary | ICD-10-CM | POA: Diagnosis not present

## 2020-10-09 DIAGNOSIS — J029 Acute pharyngitis, unspecified: Secondary | ICD-10-CM | POA: Diagnosis not present

## 2020-10-09 MED ORDER — RIZATRIPTAN BENZOATE 5 MG PO TABS: 5.0000 mg | ORAL_TABLET | ORAL | 0 refills | Status: DC | PRN

## 2020-10-09 MED ORDER — BENZONATATE 100 MG PO CAPS: 100.0000 mg | ORAL_CAPSULE | Freq: Three times a day (TID) | ORAL | 0 refills | Status: DC

## 2020-10-09 MED ORDER — IBUPROFEN 800 MG PO TABS: 800.0000 mg | ORAL_TABLET | Freq: Three times a day (TID) | ORAL | 0 refills | Status: DC | PRN

## 2020-10-09 NOTE — ED Triage Notes (Signed)
Pt presents today with continued headache, sore throat and body aches. Dx with Covid 09/24/20.

## 2020-10-09 NOTE — ED Provider Notes (Signed)
[facility]    CSN: [id number] Arrival date & time: 10/09/20  0803      History   Chief Complaint Chief Complaint  Patient presents with  . Headache  . Sore Throat    HPI Savannah Banks is a 50 y.o. female. Savannah Banks: [id number]  HPI   Headache: She was diagnosed with COVID-19 on 09/25/2019 and she reports that her symptoms were not as prevalent as they have become over the past 2 days. She states that she had a headache on half of her head since yesterday. Similar to a migraine. No head injury, light sensitivity. She did have one episode of nausea during the course of her COVDI-19 illness but not recently. 1 month ago she was given headache and dizziness medication (Meclizine) by her PCP. This medication does help but headache has not fully resolved.  No recent fever, no chest pain or SOB. She does note some mild chest discomfort when she coughs. No new fevers. No visual changes. No neck stiffness.   Past Medical History:  Diagnosis Date  . COVID-19 09/24/2020    Patient Active Problem List   Diagnosis Date Noted  . Atypical nevus 07/20/2019  . GERD (gastroesophageal reflux disease) 03/17/2018  . 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 Neoplasm of uncertain behavior 09/98/3382  . Elevated transaminase level 10/12/2016  . Constipation 10/08/2016  . Dysphagia 10/08/2016  . Lumbar back pain 10/08/2016  . Dyspnea 10/24/2015  . Lactose intolerance 10/24/2015  . Muscle spasm of back 07/25/2015  . Atypical chest pain 07/25/2015  . Hyperlipidemia 03/04/2015  . Numbness of fingers of both hands 03/04/2015  . IUD (intrauterine device) in place 02/11/2015  . Preventative health care 02/05/2015  . Fibroadenoma of breast 02/05/2015  . Dizziness 02/05/2015    Past Surgical History:  Procedure Laterality Date  . BREAST BIOPSY Left pt unsure   benign  . TONSILLECTOMY      OB History    Gravida  3   Para  1   Term  1   Preterm  0   AB  2   Living  1      SAB  2   IAB      Ectopic      Multiple      Live Births               Home Medications    Prior to Admission medications   Medication Sig Start Date End Date Taking? Authorizing Provider  benzonatate (TESSALON) 100 MG capsule Take 1 capsule (100 mg total) by mouth every 8 (eight) hours. 10/09/20  Yes Duru Reiger, Judson Roch M, PA-C  ibuprofen (ADVIL) 800 MG tablet Take 1 tablet (800 mg total) by mouth every 8 (eight) hours as needed for headache. 10/09/20  Yes Blong Busk, Judson Roch M, PA-C  rizatriptan (MAXALT) 5 MG tablet Take 1 tablet (5 mg total) by mouth as needed for migraine. May repeat in 2 hours if needed 10/09/20  Yes [place], Eliyanna Ault M, PA-C  cetirizine (ZYRTEC ALLERGY) 10 MG tablet Take 1 tablet (10 mg total) by mouth daily. 09/23/20   Jaynee Eagles, PA-C  meclizine (ANTIVERT) 25 MG tablet Take 1 tablet (25 mg total) by mouth 3 (three) times daily as needed for dizziness. 09/10/20   Leeanne Rio, MD  omeprazole (PRILOSEC) 20 MG capsule Take 1 capsule (20 mg total) by mouth every morning. Patient taking differently: Take 20 mg by mouth daily as needed.  06/13/19 08/25/20  Leeanne Rio, MD  Family History Family History  Problem Relation Age of Onset  . Diabetes Mother   . Hypertension Mother   . Liver cancer Father     Social History Social History   Tobacco Use  . Smoking status: Never Smoker  . Smokeless tobacco: Never Used  Substance Use Topics  . Alcohol use: No  . Drug use: No     Allergies   Shellfish allergy   Review of Systems Review of Systems  As stated above in HPI Physical Exam Triage Vital Signs ED Triage Vitals  Enc Vitals Group     BP 10/09/20 0822 116/81     Pulse Rate 10/09/20 0822 98     Resp 10/09/20 0822 18     Temp 10/09/20 0822 98.2 F (36.8 C)     Temp Source 10/09/20 0822 Oral     SpO2 10/09/20 0822 99 %     Weight 10/09/20 0824 132 lb 7.9 oz (60.1 kg)     Height 10/09/20 0824 5\' 2"  (1.575 m)     Head Circumference --       Peak Flow --      Pain Score 10/09/20 0823 8     Pain Loc --      Pain Edu? --      Excl. in [place]? --    No data found.  Updated Vital Signs BP 116/81 (BP Location: Right Arm)   Pulse 98   Temp 98.2 F (36.8 C) (Oral)   Resp 18   Ht 5\' 2"  (1.575 m)   Wt 132 lb 7.9 oz (60.1 kg)   SpO2 99%   BMI 24.23 kg/m    Physical Exam Vitals and nursing note reviewed.  Constitutional:      General: She is not in acute distress.    Appearance: She is well-developed. She is not ill-appearing, toxic-appearing or diaphoretic.  HENT:     Head: Normocephalic and atraumatic.     Mouth/Throat:     Mouth: Mucous membranes are moist.     Tongue: No lesions.     Pharynx: Oropharynx is clear. No pharyngeal swelling, oropharyngeal exudate, posterior oropharyngeal erythema or uvula swelling.     Tonsils: No tonsillar exudate or tonsillar abscesses.  Eyes:     General: Lids are normal. Gaze aligned appropriately. No visual field deficit or scleral icterus.       Right eye: No foreign body or discharge.        Left eye: No foreign body or discharge.     Extraocular Movements: Extraocular movements intact.     Right eye: Normal extraocular motion and no nystagmus.     Left eye: Normal extraocular motion and no nystagmus.     Conjunctiva/sclera: Conjunctivae normal.     Pupils: Pupils are equal, round, and reactive to light. Pupils are equal.  Cardiovascular:     Rate and Rhythm: Normal rate and regular rhythm.     Pulses: Normal pulses.     Heart sounds: Normal heart sounds. No murmur heard. No gallop.   Pulmonary:     Effort: Pulmonary effort is normal. No respiratory distress.     Breath sounds: Normal breath sounds. No wheezing, rhonchi or rales.  Chest:     Chest wall: No tenderness.  Musculoskeletal:     Cervical back: Normal range of motion and neck supple. No rigidity.  Lymphadenopathy:     Cervical: No cervical adenopathy.  Neurological:     General: No focal deficit present.  Mental Status: She is alert.     Cranial Nerves: No cranial nerve deficit.     Sensory: No sensory deficit.     Motor: No weakness.     Coordination: Coordination normal.     Gait: Gait normal.     Deep Tendon Reflexes: Reflexes normal.  Psychiatric:        Behavior: Behavior normal.      UC Treatments / Results  Labs (all labs ordered are listed, but only abnormal results are displayed) Labs Reviewed - No data to display  EKG   Radiology No results found.  Procedures Procedures (including critical care time)  Medications Ordered in UC Medications - No data to display  Initial Impression / Assessment and Plan / UC Course  I have reviewed the triage vital signs and the nursing notes.  Pertinent labs & imaging results that were available during my care of the patient were reviewed by me and considered in my medical decision making (see chart for details).     New. Supportive care with tessalon, rizatriptan and ibuprofen. Discussed common side effects and precautions. Discussed red flag signs and symptoms. Recommended follow up with PCP next week.     Final Clinical Impressions(s) / UC Diagnoses   Final diagnoses:  Acute intractable headache, unspecified headache type  Sore throat  Cough   Discharge Instructions   None    ED Prescriptions    Medication Sig Dispense Auth. Provider   benzonatate (TESSALON) 100 MG capsule Take 1 capsule (100 mg total) by mouth every 8 (eight) hours. 21 capsule Lachae Hohler M, [place]   ibuprofen (ADVIL) 800 MG tablet Take 1 tablet (800 mg total) by mouth every 8 (eight) hours as needed for headache. 21 tablet Kaho Selle M, PA-C   rizatriptan (MAXALT) 5 MG tablet Take 1 tablet (5 mg total) by mouth as needed for migraine. May repeat in 2 hours if needed 10 tablet Hughie Closs, [place]     PDMP not reviewed this encounter.   Hughie Closs, [place] 10/09/20 [phone]

## 2020-12-16 ENCOUNTER — Other Ambulatory Visit: Payer: Self-pay

## 2020-12-16 ENCOUNTER — Encounter (HOSPITAL_COMMUNITY): Payer: Self-pay

## 2020-12-16 ENCOUNTER — Ambulatory Visit (HOSPITAL_COMMUNITY)
Admission: EM | Admit: 2020-12-16 | Discharge: 2020-12-16 | Disposition: A | Discharge status: Home / Self Care | Payer: 59 | Attending: Student | Admitting: Student

## 2020-12-16 DIAGNOSIS — J301 Allergic rhinitis due to pollen: Secondary | ICD-10-CM

## 2020-12-16 DIAGNOSIS — J069 Acute upper respiratory infection, unspecified: Secondary | ICD-10-CM | POA: Diagnosis not present

## 2020-12-16 MED ORDER — CETIRIZINE HCL 10 MG PO TABS: 10.0000 mg | ORAL_TABLET | Freq: Every day | ORAL | 2 refills | Status: DC

## 2020-12-16 MED ORDER — PROMETHAZINE-DM 6.25-15 MG/5ML PO SYRP: 5.0000 mL | ORAL_SOLUTION | Freq: Four times a day (QID) | ORAL | 0 refills | Status: DC | PRN

## 2020-12-16 MED ORDER — BENZONATATE 100 MG PO CAPS: 100.0000 mg | ORAL_CAPSULE | Freq: Three times a day (TID) | ORAL | 0 refills | Status: DC

## 2020-12-16 NOTE — ED Provider Notes (Signed)
[place]    CSN: [id number] Arrival date & time: 12/16/20  0801      History   Chief Complaint Chief Complaint  Patient presents with  . Sore Throat  . Generalized Body Aches  . Cough    HPI Savannah Banks is a 50 y.o. female presenting with viral URI symptoms.  Medical history atypical chest pain, dysphagia, GERD, hyperlipidemia.  Notes viral URI symptoms for 24 hours.  Nasal congestion, cough, sore throat, body aches.  States he also has a history of allergic rhinitis for which she is taking no medications.  Has tried Tylenol one time but has tried no other medications for her symptoms. Denies fevers/chills, n/v/d, shortness of breath, chest pain, facial pain, teeth pain, headaches,  loss of taste/smell, swollen lymph nodes, ear pain. She declines translation services as she speaks [place].  HPI  Past Medical History:  Diagnosis Date  . COVID-19 09/24/2020    Patient Active Problem List   Diagnosis Date Noted  . Atypical nevus 07/20/2019  . GERD (gastroesophageal reflux disease) 03/17/2018  . 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 Neoplasm of uncertain behavior 18/29/9371  . Elevated transaminase level 10/12/2016  . Constipation 10/08/2016  . Dysphagia 10/08/2016  . Lumbar back pain 10/08/2016  . Dyspnea 10/24/2015  . Lactose intolerance 10/24/2015  . Muscle spasm of back 07/25/2015  . Atypical chest pain 07/25/2015  . Hyperlipidemia 03/04/2015  . Numbness of fingers of both hands 03/04/2015  . IUD (intrauterine device) in place 02/11/2015  . Preventative health care 02/05/2015  . Fibroadenoma of breast 02/05/2015  . Dizziness 02/05/2015    Past Surgical History:  Procedure Laterality Date  . BREAST BIOPSY Left pt unsure   benign  . TONSILLECTOMY      OB History    Gravida  3   Para  1   Term  1   Preterm  0   AB  2   Living  1     SAB  2   IAB      Ectopic      Multiple      Live Births               Home Medications     Prior to Admission medications   Medication Sig Start Date End Date Taking? Authorizing Provider  benzonatate (TESSALON) 100 MG capsule Take 1 capsule (100 mg total) by mouth every 8 (eight) hours. 12/16/20  Yes Hazel Sams, PA-C  cetirizine (ZYRTEC ALLERGY) 10 MG tablet Take 1 tablet (10 mg total) by mouth daily. 12/16/20  Yes Hazel Sams, PA-C  promethazine-dextromethorphan (PROMETHAZINE-DM) 6.25-15 MG/5ML syrup Take 5 mLs by mouth 4 (four) times daily as needed for cough. 12/16/20  Yes Hazel Sams, PA-C  ibuprofen (ADVIL) 800 MG tablet Take 1 tablet (800 mg total) by mouth every 8 (eight) hours as needed for headache. 10/09/20   Hughie Closs, PA-C  meclizine (ANTIVERT) 25 MG tablet Take 1 tablet (25 mg total) by mouth 3 (three) times daily as needed for dizziness. 09/10/20   Leeanne Rio, MD  rizatriptan (MAXALT) 5 MG tablet Take 1 tablet (5 mg total) by mouth as needed for migraine. May repeat in 2 hours if needed 10/09/20   Hughie Closs, PA-C  omeprazole (PRILOSEC) 20 MG capsule Take 1 capsule (20 mg total) by mouth every morning. Patient taking differently: Take 20 mg by mouth daily as needed.  06/13/19 08/25/20  Leeanne Rio, MD  Family History Family History  Problem Relation Age of Onset  . Diabetes Mother   . Hypertension Mother   . Liver cancer Father     Social History Social History   Tobacco Use  . Smoking status: Never Smoker  . Smokeless tobacco: Never Used  Substance Use Topics  . Alcohol use: No  . Drug use: No     Allergies   Shellfish allergy   Review of Systems Review of Systems  Constitutional: Negative for appetite change, chills and fever.  HENT: Positive for congestion and sore throat. Negative for ear pain, rhinorrhea, sinus pressure and sinus pain.   Eyes: Negative for redness and visual disturbance.  Respiratory: Positive for cough. Negative for chest tightness, shortness of breath and wheezing.    Cardiovascular: Negative for chest pain and palpitations.  Gastrointestinal: Negative for abdominal pain, constipation, diarrhea, nausea and vomiting.  Genitourinary: Negative for dysuria, frequency and urgency.  Musculoskeletal: Positive for myalgias.  Neurological: Negative for dizziness, weakness and headaches.  Psychiatric/Behavioral: Negative for confusion.  All other systems reviewed and are negative.    Physical Exam Triage Vital Signs ED Triage Vitals  Enc Vitals Group     BP      Pulse      Resp      Temp      Temp src      SpO2      Weight      Height      Head Circumference      Peak Flow      Pain Score      Pain Loc      Pain Edu?      Excl. in [place]?    No data found.  Updated Vital Signs BP 102/69   Pulse 85   Temp 98.6 F (37 C)   Resp 18   SpO2 100%   Visual Acuity Right Eye Distance:   Left Eye Distance:   Bilateral Distance:    Right Eye Near:   Left Eye Near:    Bilateral Near:     Physical Exam Vitals reviewed.  Constitutional:      General: She is not in acute distress.    Appearance: Normal appearance. She is not ill-appearing.  HENT:     Head: Normocephalic and atraumatic.     Right Ear: Hearing, tympanic membrane, ear canal and external ear normal. No swelling or tenderness. There is no impacted cerumen. No mastoid tenderness. Tympanic membrane is not perforated, erythematous, retracted or bulging.     Left Ear: Hearing, tympanic membrane, ear canal and external ear normal. No swelling or tenderness. There is no impacted cerumen. No mastoid tenderness. Tympanic membrane is not perforated, erythematous, retracted or bulging.     Nose:     Right Sinus: No maxillary sinus tenderness or frontal sinus tenderness.     Left Sinus: No maxillary sinus tenderness or frontal sinus tenderness.     Mouth/Throat:     Mouth: Mucous membranes are moist.     Pharynx: Uvula midline. No oropharyngeal exudate or posterior oropharyngeal erythema.      Tonsils: No tonsillar exudate.  Cardiovascular:     Rate and Rhythm: Normal rate and regular rhythm.     Heart sounds: Normal heart sounds.  Pulmonary:     Breath sounds: Normal breath sounds and air entry. No wheezing, rhonchi or rales.  Chest:     Chest wall: No tenderness.  Abdominal:     General: Abdomen is flat.  Bowel sounds are normal.     Tenderness: There is no abdominal tenderness. There is no guarding or rebound.  Lymphadenopathy:     Cervical: No cervical adenopathy.  Neurological:     General: No focal deficit present.     Mental Status: She is alert and oriented to person, place, and time.  Psychiatric:        Attention and Perception: Attention and perception normal.        Mood and Affect: Mood and affect normal.        Behavior: Behavior normal. Behavior is cooperative.        Thought Content: Thought content normal.        Judgment: Judgment normal.      UC Treatments / Results  Labs (all labs ordered are listed, but only abnormal results are displayed) Labs Reviewed - No data to display  EKG   Radiology No results found.  Procedures Procedures (including critical care time)  Medications Ordered in UC Medications - No data to display  Initial Impression / Assessment and Plan / UC Course  I have reviewed the triage vital signs and the nursing notes.  Pertinent labs & imaging results that were available during my care of the patient were reviewed by me and considered in my medical decision making (see chart for details).     This patient is a 50 year old female presenting with viral URI and allergic rhinitis. Today this pt is afebrile nontachycardic nontachypneic, oxygenating well on room air, no wheezes rhonchi or rales.   Tessalon, promethazine, Zyrtec, Tylenol/ibuprofen as below. ED return precautions discussed.  Final Clinical Impressions(s) / UC Diagnoses   Final diagnoses:  Viral URI with cough  Seasonal allergic rhinitis due to pollen      Discharge Instructions     -Tessalon (Benzonatate) as needed for cough. Take one pill up to 3x daily (every 8 hours) -Promethazine DM cough syrup for congestion/cough. This could make you drowsy, so take at night before bed. -Zyrtec once daily for allergies, try this for at least 1 week  -Take Tylenol 1000 mg 3 times daily, and ibuprofen 800 mg 3 times daily with food.  You can take these together, or alternate every 3-4 hours. -Come back and see [place] if your symptoms worsen or persist, like new fevers/chills, shortness of breath    ED Prescriptions    Medication Sig Dispense Auth. Provider   benzonatate (TESSALON) 100 MG capsule Take 1 capsule (100 mg total) by mouth every 8 (eight) hours. 21 capsule Hazel Sams, PA-C   promethazine-dextromethorphan (PROMETHAZINE-DM) 6.25-15 MG/5ML syrup Take 5 mLs by mouth 4 (four) times daily as needed for cough. 118 mL Hazel Sams, PA-C   cetirizine (ZYRTEC ALLERGY) 10 MG tablet Take 1 tablet (10 mg total) by mouth daily. 30 tablet Hazel Sams, PA-C     PDMP not reviewed this encounter.   Hazel Sams, PA-C 12/16/20 [phone]

## 2020-12-16 NOTE — ED Triage Notes (Signed)
Pt in with c/o ST, cough, and body aches that started yesterday  Pt took tylenol with minimal relief

## 2020-12-16 NOTE — Discharge Instructions (Addendum)
-  Tessalon (Benzonatate) as needed for cough. Take one pill up to 3x daily (every 8 hours) -Promethazine DM cough syrup for congestion/cough. This could make you drowsy, so take at night before bed. -Zyrtec once daily for allergies, try this for at least 1 week  -Take Tylenol 1000 mg 3 times daily, and ibuprofen 800 mg 3 times daily with food.  You can take these together, or alternate every 3-4 hours. -Come back and see [place] if your symptoms worsen or persist, like new fevers/chills, shortness of breath

## 2021-01-07 ENCOUNTER — Other Ambulatory Visit: Payer: Self-pay

## 2021-01-07 ENCOUNTER — Encounter: Payer: Self-pay | Admitting: Family Medicine

## 2021-01-07 ENCOUNTER — Ambulatory Visit (INDEPENDENT_AMBULATORY_CARE_PROVIDER_SITE_OTHER): Payer: 59 | Admitting: Family Medicine

## 2021-01-07 VITALS — BP 100/65 | HR 64 | Ht 59.0 in | Wt 131.8 lb

## 2021-01-07 DIAGNOSIS — Z13220 Encounter for screening for lipoid disorders: Secondary | ICD-10-CM | POA: Diagnosis not present

## 2021-01-07 DIAGNOSIS — Z1211 Encounter for screening for malignant neoplasm of colon: Secondary | ICD-10-CM | POA: Diagnosis not present

## 2021-01-07 DIAGNOSIS — Z9189 Other specified personal risk factors, not elsewhere classified: Secondary | ICD-10-CM | POA: Diagnosis not present

## 2021-01-07 NOTE — Patient Instructions (Signed)
It was great to see you again today!  Checking bloodwork today Referring to GI doctor for colonoscopy Let me know if you want to talk more about the other things we discussed today  Be well, Dr. Ardelia Mems
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
## 2021-01-08 ENCOUNTER — Encounter: Payer: Self-pay | Admitting: Family Medicine

## 2021-01-08 LAB — LIPID PANEL
Chol/HDL Ratio: 4.7 ratio — ABNORMAL HIGH (ref 0.0–4.4)
Cholesterol, Total: 196 mg/dL (ref 100–199)
HDL: 42 mg/dL (ref >39)
LDL Chol Calc (NIH): 131 mg/dL — ABNORMAL HIGH (ref 0–99)
Triglycerides: 126 mg/dL (ref 0–149)
VLDL Cholesterol Cal: 23 mg/dL (ref 5–40)

## 2021-01-08 LAB — CMP14+EGFR
ALT: 19 IU/L (ref 0–32)
AST: 15 IU/L (ref 0–40)
Albumin/Globulin Ratio: 1.7 (ref 1.2–2.2)
Albumin: 4.5 g/dL (ref 3.8–4.8)
Alkaline Phosphatase: 75 IU/L (ref 44–121)
BUN/Creatinine Ratio: 15 (ref 9–23)
BUN: 9 mg/dL (ref 6–24)
Bilirubin Total: 0.4 mg/dL (ref 0.0–1.2)
CO2: 22 mmol/L (ref 20–29)
Calcium: 9.1 mg/dL (ref 8.7–10.2)
Chloride: 105 mmol/L (ref 96–106)
Creatinine, Ser: 0.6 mg/dL (ref 0.57–1.00)
Globulin, Total: 2.6 g/dL (ref 1.5–4.5)
Glucose: 81 mg/dL (ref 65–99)
Potassium: 4.1 mmol/L (ref 3.5–5.2)
Sodium: 142 mmol/L (ref 134–144)
Total Protein: 7.1 g/dL (ref 6.0–8.5)
eGFR: 111 mL/min/{1.73_m2} (ref >59)

## 2021-01-13 DIAGNOSIS — Z9189 Other specified personal risk factors, not elsewhere classified: Secondary | ICD-10-CM | POA: Insufficient documentation
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
## 2021-02-19 ENCOUNTER — Other Ambulatory Visit: Payer: Self-pay | Admitting: Gynecology

## 2021-02-19 DIAGNOSIS — R928 Other abnormal and inconclusive findings on diagnostic imaging of breast: Secondary | ICD-10-CM

## 2021-03-12 ENCOUNTER — Ambulatory Visit: Payer: 59

## 2021-03-12 ENCOUNTER — Ambulatory Visit
Admission: RE | Admit: 2021-03-12 | Discharge: 2021-03-12 | Disposition: A | Discharge status: Home / Self Care | Payer: 59 | Source: Ambulatory Visit | Attending: Gynecology | Admitting: Gynecology

## 2021-03-12 ENCOUNTER — Other Ambulatory Visit: Payer: Self-pay

## 2021-03-12 DIAGNOSIS — R928 Other abnormal and inconclusive findings on diagnostic imaging of breast: Secondary | ICD-10-CM

## 2021-11-11 ENCOUNTER — Ambulatory Visit (INDEPENDENT_AMBULATORY_CARE_PROVIDER_SITE_OTHER): Payer: Managed Care, Other (non HMO) | Admitting: Family Medicine

## 2021-11-11 ENCOUNTER — Other Ambulatory Visit: Payer: Self-pay

## 2021-11-11 ENCOUNTER — Encounter: Payer: Self-pay | Admitting: Family Medicine

## 2021-11-11 VITALS — BP 104/67 | HR 74 | Ht 59.0 in | Wt 129.0 lb

## 2021-11-11 DIAGNOSIS — Z13220 Encounter for screening for lipoid disorders: Secondary | ICD-10-CM

## 2021-11-11 DIAGNOSIS — Z9189 Other specified personal risk factors, not elsewhere classified: Secondary | ICD-10-CM

## 2021-11-11 DIAGNOSIS — Z23 Encounter for immunization: Secondary | ICD-10-CM

## 2021-11-11 DIAGNOSIS — Z0000 Encounter for general adult medical examination without abnormal findings: Secondary | ICD-10-CM
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
## 2021-11-11 NOTE — Assessment & Plan Note (Addendum)
Patient denies safety concerns presently, though is tearful during discussion. ?Offered supportive listening. Denies SI/HI. Continue to discuss at future visits. ?

## 2021-11-11 NOTE — Patient Instructions (Signed)

## 2022-01-12 ENCOUNTER — Other Ambulatory Visit: Payer: Self-pay

## 2022-01-12 DIAGNOSIS — Z13220 Encounter for screening for lipoid disorders: Secondary | ICD-10-CM

## 2022-01-13 LAB — LIPID PANEL
Chol/HDL Ratio: 3.6 ratio (ref 0.0–4.4)
Cholesterol, Total: 164 mg/dL (ref 100–199)
HDL: 45 mg/dL (ref >39)
LDL Chol Calc (NIH): 101 mg/dL — ABNORMAL HIGH (ref 0–99)
Triglycerides: 95 mg/dL (ref 0–149)
VLDL Cholesterol Cal: 18 mg/dL (ref 5–40)

## 2022-01-13 LAB — BASIC METABOLIC PANEL
BUN/Creatinine Ratio: 17 (ref 9–23)
BUN: 9 mg/dL (ref 6–24)
CO2: 21 mmol/L (ref 20–29)
Calcium: 8.8 mg/dL (ref 8.7–10.2)
Chloride: 105 mmol/L (ref 96–106)
Creatinine, Ser: 0.54 mg/dL — ABNORMAL LOW (ref 0.57–1.00)
Glucose: 75 mg/dL (ref 70–99)
Potassium: 4.2 mmol/L (ref 3.5–5.2)
Sodium: 139 mmol/L (ref 134–144)
eGFR: 113 mL/min/{1.73_m2} (ref >59)

## 2022-01-16 ENCOUNTER — Encounter: Payer: Self-pay | Admitting: Family Medicine

## 2022-05-04 ENCOUNTER — Encounter: Payer: Self-pay | Admitting: Family Medicine

## 2022-05-04 ENCOUNTER — Ambulatory Visit (INDEPENDENT_AMBULATORY_CARE_PROVIDER_SITE_OTHER): Payer: Commercial Managed Care - HMO | Admitting: Family Medicine

## 2022-05-04 VITALS — BP 100/65 | HR 69 | Ht 59.0 in | Wt 126.6 lb

## 2022-05-04 DIAGNOSIS — R81 Glycosuria: Secondary | ICD-10-CM

## 2022-05-04 DIAGNOSIS — R928 Other abnormal and inconclusive findings on diagnostic imaging of breast: Secondary | ICD-10-CM | POA: Insufficient documentation

## 2022-05-04 LAB — POCT GLYCOSYLATED HEMOGLOBIN (HGB A1C): Hemoglobin A1C: 5.3 % (ref 4.0–5.6)

## 2022-05-04 IMAGING — MG MM DIGITAL DIAGNOSTIC UNILAT*R* W/ TOMO W/ CAD
4 series · 4 of 12 positions shown · non-contrast
Comparison: Previous exams including recent outside screening
mammogram dated 01/29/2021.

CLINICAL DATA: Patient presents today to evaluate possible RIGHT
breast asymmetries questioned on outside screening mammogram.

EXAM:
DIGITAL DIAGNOSTIC UNILATERAL RIGHT MAMMOGRAM WITH TOMOSYNTHESIS AND
CAD
TECHNIQUE: Right digital diagnostic mammography and breast tomosynthesis was
performed. The images were evaluated with computer-aided detection.

[R MLO synth-2D]
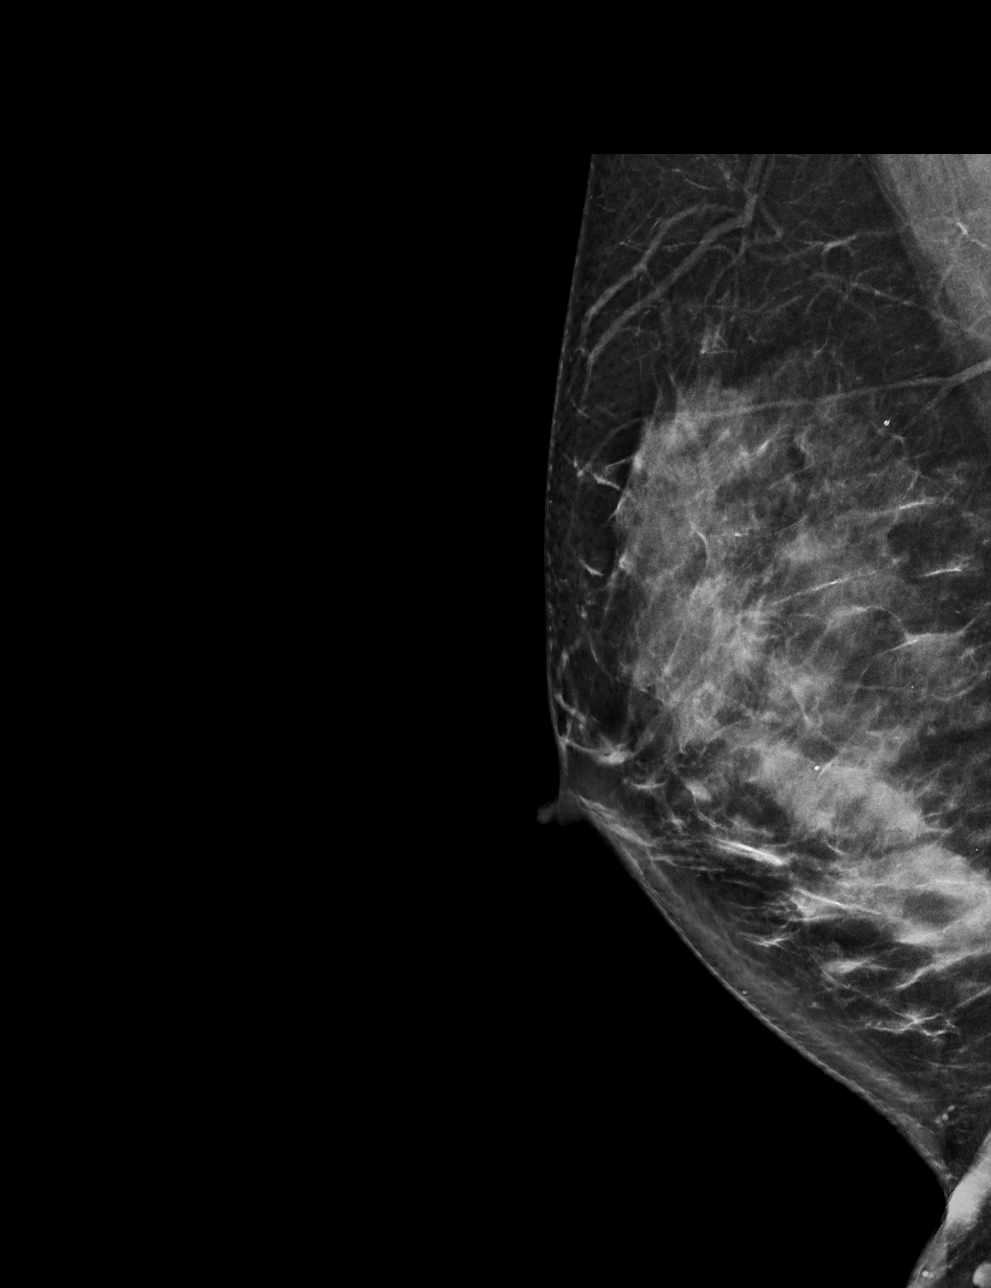

[R CC synth-2D]
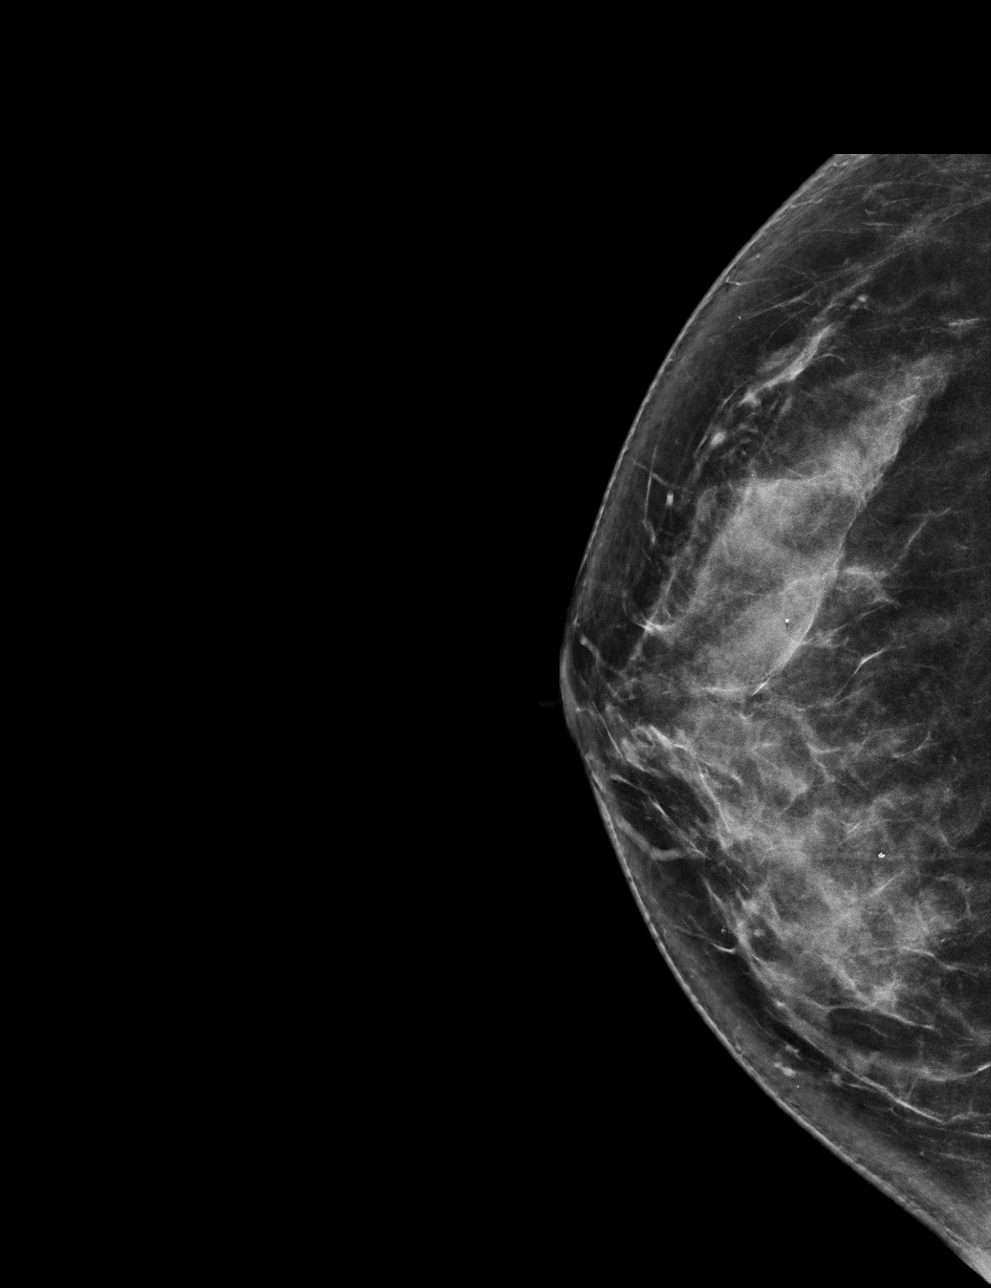

[R CC tomo · tomo slice 39/76.0]
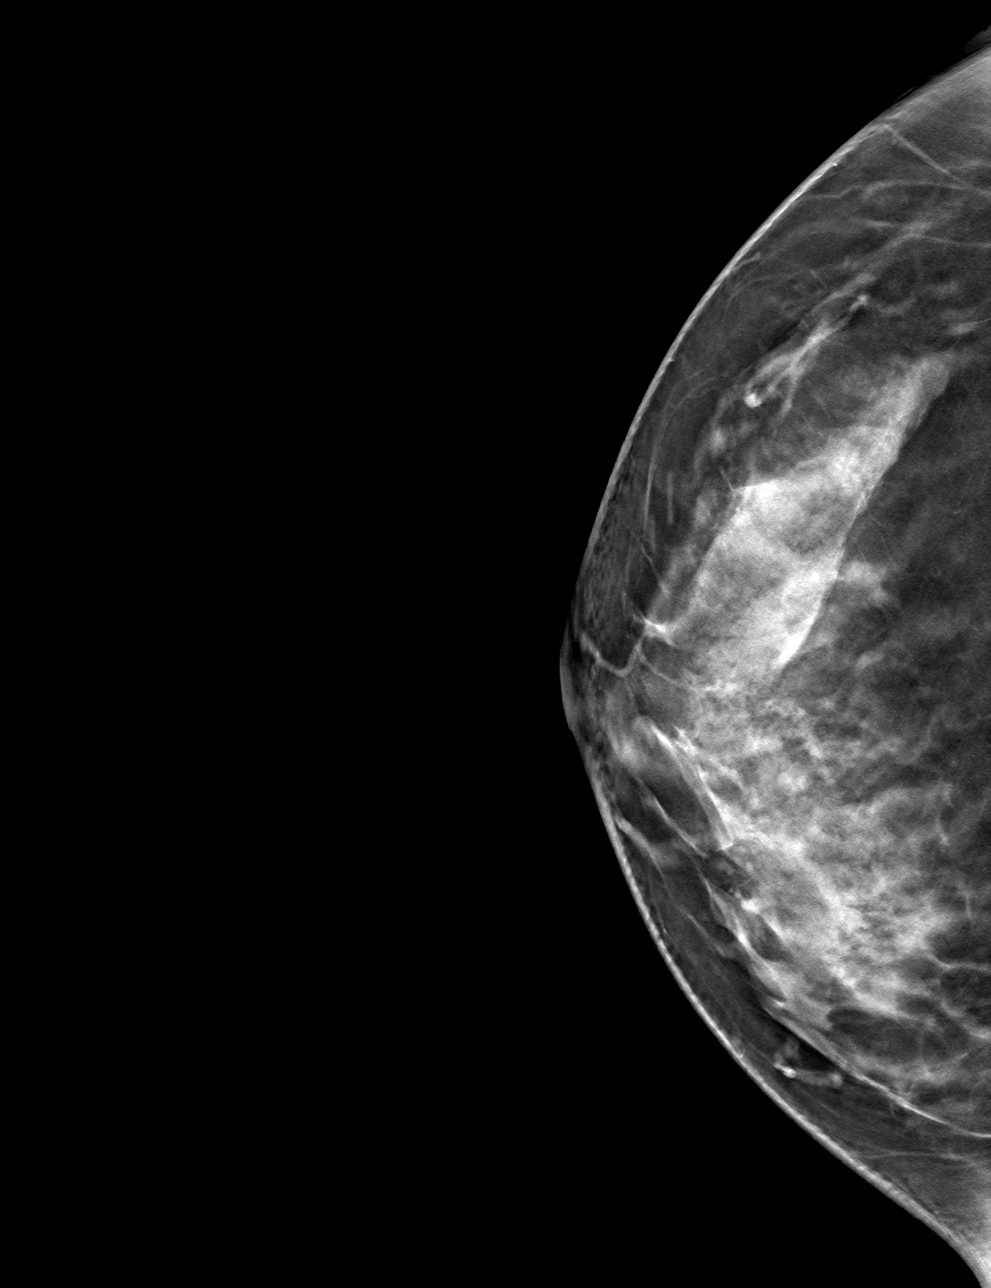

[R MLO tomo · tomo slice 35/68.0]
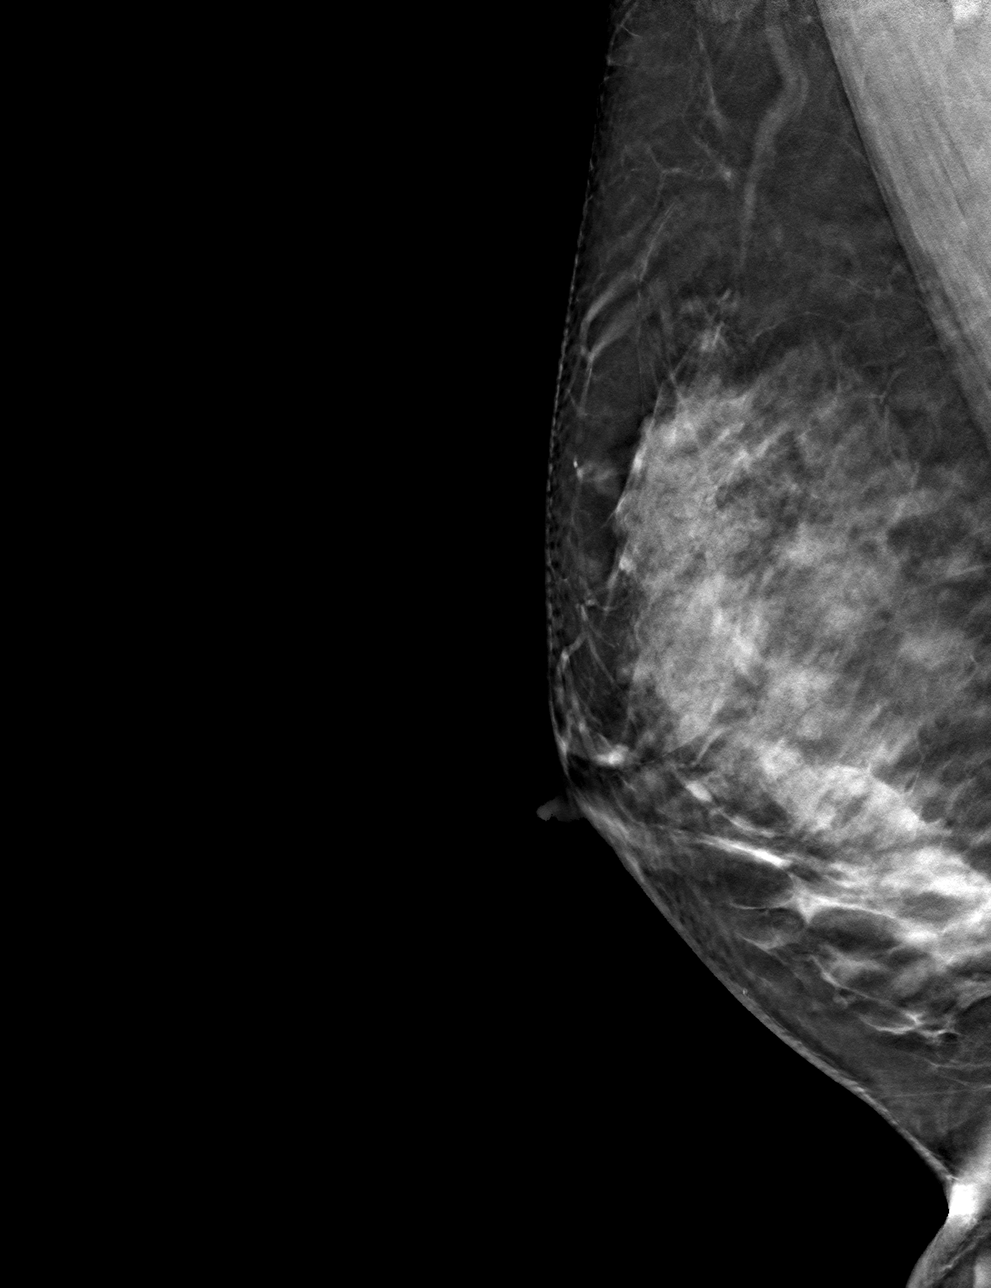

[4 of 12 positions shown; findings below may reference images not displayed]

ACR Breast Density Category c: The breast tissue is heterogeneously
dense, which may obscure small masses.
FINDINGS: On today's additional diagnostic views, there is no persistent
asymmetry within the RIGHT breast indicating superimposition of
normal dense fibroglandular tissues.
IMPRESSION: No evidence of malignancy.

RECOMMENDATION:
Screening mammogram in one year.(Code:YN-1-NUT)

I have discussed the findings and recommendations with the patient
with the aid of an interpreter. If applicable, a reminder letter
will be sent to the patient regarding the next appointment.

BI-RADS CATEGORY  1: Negative.

## 2022-05-04 NOTE — Assessment & Plan Note (Signed)
Reviewed mammo result in Care Everywhere, see above. Discussed options for further eval including going through [facility] or I can order diagnostic imaging for her, she prefers I order it.   Ordered diagnostic mammogram of L breast along with L u/s as recommended by mammo report. Patient scheduled for imaging & given appointment info.

## 2022-05-04 NOTE — Progress Notes (Signed)
  Date of Visit: 05/04/2022   SUBJECTIVE:   HPI:  Savannah Banks presents today for concern for diabetes and abnormal mammogram. [place] interpreter utilized during this visit.   Concern for diabetes - reports at recent visit with OBGYN office had a lot of sugar in her urine, and was told she may have diabetes.   Abnormal mammogram - had abnormal breast exam at "breast examination doctor" but cannot recall specifics of who that was. Reports being told she might have cancer and is supposed to follow up with PCP for further discussion. Has letter with her from [place] saying after her screening mammogram they recommend getting additional mammgoraphic views and an ultrasound study. It does not seem that she had this done - language barrier makes history challenging to obtain, and virtual interpreter also struggles to understand patient's history.   Reviewed mammogram in care everywhere: Mammo, Screening, Tomosynthesis, Bilateral, W/ Cad : CLINICAL DATA: Screening. EXAM: DIGITAL SCREENING BILATERAL MAMMOGRAM WITH TOMOSYNTHESIS AND CAD TECHNIQUE: Bilateral screening digital craniocaudal and mediolateral oblique mammograms were obtained. Bilateral screening digital breast tomosynthesis was performed. The images were evaluated with computer-aided detection. COMPARISON: Previous exam(s). ACR Breast Density Category c: The breast tissue is heterogeneously dense, which may obscure small masses. FINDINGS: In the left breast, a possible asymmetry warrants further evaluation. In the right breast, no findings suspicious for malignancy. IMPRESSION: Further evaluation is suggested for possible asymmetry in the left breast. RECOMMENDATION: Diagnostic mammogram and possibly ultrasound of the left breast. (Code:FI-L-92M) The patient will be contacted regarding the findings, and additional imaging will be scheduled. BI-RADS CATEGORY 0: Incomplete. Need additional imaging evaluation and/or prior mammograms for  comparison. Electronically Signed By: Ammie Ferrier M.D. On: 02/05/2022 08:38   OBJECTIVE:   BP 100/65   Pulse 69   Ht '4\' 11"'$  (1.499 m)   Wt 126 lb 9.6 oz (57.4 kg)   SpO2 100%   BMI 25.57 kg/m  Gen: no acute distress, pleasant, cooperative HEENT: normocephalic, atraumatic  Lungs: normal work of breathing on room air Neuro: alert, speech normal, grossly nonfocal Ext: No appreciable lower extremity edema bilaterally   ASSESSMENT/PLAN:   Abnormal screening mammogram Reviewed mammo result in Care Everywhere, see above. Discussed options for further eval including going through [facility] or I can order diagnostic imaging for her, she prefers I order it.   Ordered diagnostic mammogram of L breast along with L u/s as recommended by mammo report. Patient scheduled for imaging & given appointment info.  Glucosuria - A1c today is normal; reassured patient  Savannah Banks, Savannah Banks than 30 minutes were spent on this encounter on the day of service, including pre-visit planning, actual face to face time, coordination of care, and documentation of visit.

## 2022-05-04 NOTE — Patient Instructions (Signed)
Scheduled mammogram and ultrasound appointment. Test for diabetes is negative - this is good news.  Be well, Dr. Ardelia Mems

## 2022-05-25 ENCOUNTER — Ambulatory Visit
Admission: RE | Admit: 2022-05-25 | Discharge: 2022-05-25 | Disposition: A | Discharge status: Home / Self Care | Payer: Commercial Managed Care - HMO | Source: Ambulatory Visit | Attending: Family Medicine | Admitting: Family Medicine

## 2022-05-25 DIAGNOSIS — R928 Other abnormal and inconclusive findings on diagnostic imaging of breast: Secondary | ICD-10-CM

## 2022-06-16 ENCOUNTER — Ambulatory Visit (HOSPITAL_COMMUNITY)
Admission: EM | Admit: 2022-06-16 | Discharge: 2022-06-16 | Disposition: A | Discharge status: Home / Self Care | Payer: Commercial Managed Care - HMO

## 2022-06-16 ENCOUNTER — Encounter (HOSPITAL_COMMUNITY): Payer: Self-pay | Admitting: *Deleted

## 2022-06-16 DIAGNOSIS — J069 Acute upper respiratory infection, unspecified: Secondary | ICD-10-CM

## 2022-06-16 DIAGNOSIS — J029 Acute pharyngitis, unspecified: Secondary | ICD-10-CM

## 2022-06-16 MED ORDER — PSEUDOEPHEDRINE HCL 30 MG PO TABS: 30.0000 mg | ORAL_TABLET | ORAL | 0 refills | Status: DC | PRN

## 2022-06-16 MED ORDER — IBUPROFEN 600 MG PO TABS: 600.0000 mg | ORAL_TABLET | Freq: Three times a day (TID) | ORAL | 0 refills | Status: DC

## 2022-06-16 NOTE — Discharge Instructions (Addendum)
Advised to take the ibuprofen every 8 hours with food to help relieve the pain of the sore throat. Advised take Sudafed every 12 hours to help decrease the congestion and drainage that is causing the sore throat. Advised to continue to use salt water gargles and lozenges to help soothe the throat. Advised to follow-up PCP or return to urgent care if symptoms fail to improve

## 2022-06-16 NOTE — ED Provider Notes (Signed)
[facility]    CSN: [id number] Arrival date & time: 06/16/22  7673      History   Chief Complaint Chief Complaint  Patient presents with   Sore Throat    HPI Savannah Banks is a 51 y.o. female.   51 year old female presents with sore throat.  Patient indicates for the past week she has been having persistent sore throat and painful swallowing.  She indicates that she feels drainage that is going down the back of her throat contributing to the sore throat.  She has some mild upper respiratory congestion and rhinitis which is mainly clear.  She indicates she has had some mild cough but no fever or chills.  She does indicate taking some Tylenol which has given her a little bit of relief.  He denies having any sinus pain or pressure.  She indicates that she has not been around any family, friends, or coworkers that have been sick.  She is tolerating fluids well.   Sore Throat    Past Medical History:  Diagnosis Date   COVID-19 09/24/2020    Patient Active Problem List   Diagnosis Date Noted   Abnormal screening mammogram 05/04/2022   Does not feel safe at home 01/13/2021   Atypical nevus 07/20/2019   GERD (gastroesophageal reflux disease) 03/17/2018   Hemochromatosis carrier 01/27/2017   Neoplasm of uncertain behavior 41/93/7902   Elevated transaminase level 10/12/2016   Constipation 10/08/2016   Dysphagia 10/08/2016   Lumbar back pain 10/08/2016   Dyspnea 10/24/2015   Lactose intolerance 10/24/2015   Muscle spasm of back 07/25/2015   Atypical chest pain 07/25/2015   Hyperlipidemia 03/04/2015   Numbness of fingers of both hands 03/04/2015   IUD (intrauterine device) in place 02/11/2015   Preventative health care 02/05/2015   Fibroadenoma of breast 02/05/2015   Dizziness 02/05/2015    Past Surgical History:  Procedure Laterality Date   BREAST BIOPSY Left pt unsure   benign   TONSILLECTOMY      OB History     Gravida  3   Para  1   Term  1    Preterm  0   AB  2   Living  1      SAB  2   IAB      Ectopic      Multiple      Live Births               Home Medications    Prior to Admission medications   Medication Sig Start Date End Date Taking? Authorizing Provider  ibuprofen (ADVIL) 600 MG tablet Take 1 tablet (600 mg total) by mouth 3 (three) times daily. 06/16/22  Yes Nyoka Lint, PA-C  levonorgestrel (MIRENA, 52 MG,) 20 MCG/DAY IUD Take 1 device by intrauterine route. 01/24/20  Yes [provider]  pseudoephedrine (SUDAFED) 30 MG tablet Take 1 tablet (30 mg total) by mouth every 4 (four) hours as needed for congestion. 06/16/22  Yes Nyoka Lint, PA-C  omeprazole (PRILOSEC) 20 MG capsule Take 1 capsule (20 mg total) by mouth every morning. Patient taking differently: Take 20 mg by mouth daily as needed.  06/13/19 08/25/20  Leeanne Rio, MD    Family History Family History  Problem Relation Age of Onset   Cancer Mother        cervical   Diabetes Mother    Hypertension Mother    Cancer Father        liver  Liver cancer Father    Cancer Paternal Grandmother        nose cancer    Social History Social History   Tobacco Use   Smoking status: Never   Smokeless tobacco: Never  Substance Use Topics   Alcohol use: No   Drug use: No     Allergies   Shellfish allergy   Review of Systems Review of Systems  HENT:  Positive for postnasal drip, sinus pressure and sore throat.      Physical Exam Triage Vital Signs ED Triage Vitals  Enc Vitals Group     BP 06/16/22 0910 110/73     Pulse Rate 06/16/22 0910 81     Resp 06/16/22 0910 18     Temp 06/16/22 0910 98.4 F (36.9 C)     Temp Source 06/16/22 0910 Oral     SpO2 06/16/22 0910 98 %     Weight --      Height --      Head Circumference --      Peak Flow --      Pain Score 06/16/22 0909 8     Pain Loc --      Pain Edu? --      Excl. in [place]? --    No data found.  Updated Vital Signs BP 110/73 (BP Location: Right  Arm)   Pulse 81   Temp 98.4 F (36.9 C) (Oral)   Resp 18   SpO2 98%   Visual Acuity Right Eye Distance:   Left Eye Distance:   Bilateral Distance:    Right Eye Near:   Left Eye Near:    Bilateral Near:     Physical Exam Constitutional:      Appearance: She is well-developed.  HENT:     Right Ear: Tympanic membrane and ear canal normal.     Left Ear: Tympanic membrane and ear canal normal.     Mouth/Throat:     Mouth: Mucous membranes are moist.     Pharynx: Oropharynx is clear. No posterior oropharyngeal erythema.     Comments: Throat: Posterior pharynx with mild redness and cobblestoning present due to postnasal drainage.  There is no other redness or exudate present. Cardiovascular:     Rate and Rhythm: Normal rate and regular rhythm.     Heart sounds: Normal heart sounds.  Pulmonary:     Effort: Pulmonary effort is normal.     Breath sounds: Normal breath sounds and air entry. No wheezing, rhonchi or rales.  Lymphadenopathy:     Cervical: No cervical adenopathy.  Neurological:     Mental Status: She is alert.      UC Treatments / Results  Labs (all labs ordered are listed, but only abnormal results are displayed) Labs Reviewed - No data to display  EKG   Radiology No results found.  Procedures Procedures (including critical care time)  Medications Ordered in UC Medications - No data to display  Initial Impression / Assessment and Plan / UC Course  I have reviewed the triage vital signs and the nursing notes.  Pertinent labs & imaging results that were available during my care of the patient were reviewed by me and considered in my medical decision making (see chart for details).    Plan: 1.  The pharyngitis will be treated with the following: A.  Ibuprofen 600 mg every 8 hours to help relieve the pain from the sore throat. B.  Advised salt water gargles, 3-4 times throughout the day  along with lozenges to help soothe the sore throat. 2.  The upper  respiratory infection was treated with the following: A.  Sudafed every 12 hours to help decrease the upper respiratory congestion and drainage. 3.  Advised patient to follow-up with PCP or return to urgent care if symptoms fail to improve. Final Clinical Impressions(s) / UC Diagnoses   Final diagnoses:  Pharyngitis, unspecified etiology  Viral upper respiratory tract infection     Discharge Instructions      Advised to take the ibuprofen every 8 hours with food to help relieve the pain of the sore throat. Advised take Sudafed every 12 hours to help decrease the congestion and drainage that is causing the sore throat. Advised to continue to use salt water gargles and lozenges to help soothe the throat. Advised to follow-up PCP or return to urgent care if symptoms fail to improve    ED Prescriptions     Medication Sig Dispense Auth. Provider   pseudoephedrine (SUDAFED) 30 MG tablet Take 1 tablet (30 mg total) by mouth every 4 (four) hours as needed for congestion. 30 tablet Nyoka Lint, PA-C   ibuprofen (ADVIL) 600 MG tablet Take 1 tablet (600 mg total) by mouth 3 (three) times daily. 30 tablet Nyoka Lint, PA-C      PDMP not reviewed this encounter.   Nyoka Lint, PA-C 06/16/22 [phone]

## 2022-06-16 NOTE — ED Triage Notes (Signed)
Pt states she has had a sore throat x 1 week she took tylenol it didn't help so she is here. No other sx.

## 2022-09-08 ENCOUNTER — Other Ambulatory Visit: Payer: Self-pay

## 2022-09-08 ENCOUNTER — Encounter (HOSPITAL_COMMUNITY): Payer: Self-pay | Admitting: *Deleted

## 2022-09-08 ENCOUNTER — Ambulatory Visit (HOSPITAL_COMMUNITY)
Admission: EM | Admit: 2022-09-08 | Discharge: 2022-09-08 | Disposition: A | Discharge status: Home / Self Care | Payer: 59 | Attending: Physician Assistant | Admitting: Physician Assistant

## 2022-09-08 DIAGNOSIS — Z1152 Encounter for screening for COVID-19: Secondary | ICD-10-CM | POA: Diagnosis not present

## 2022-09-08 DIAGNOSIS — J111 Influenza due to unidentified influenza virus with other respiratory manifestations: Secondary | ICD-10-CM | POA: Diagnosis not present

## 2022-09-08 DIAGNOSIS — J029 Acute pharyngitis, unspecified: Secondary | ICD-10-CM

## 2022-09-08 LAB — POCT RAPID STREP A, ED / UC: Streptococcus, Group A Screen (Direct): NEGATIVE

## 2022-09-08 LAB — POC INFLUENZA A AND B ANTIGEN (URGENT CARE ONLY)
INFLUENZA A ANTIGEN, POC: POSITIVE — AB
INFLUENZA B ANTIGEN, POC: NEGATIVE

## 2022-09-08 MED ORDER — OSELTAMIVIR PHOSPHATE 75 MG PO CAPS: 75.0000 mg | ORAL_CAPSULE | Freq: Two times a day (BID) | ORAL | 0 refills | Status: AC

## 2022-09-08 NOTE — ED Triage Notes (Signed)
Pt reports sore throat and chills started last night.

## 2022-09-08 NOTE — ED Provider Notes (Addendum)
[facility]    CSN: [id number] Arrival date & time: 09/08/22  1437      History   Chief Complaint Chief Complaint  Patient presents with   Sore Throat   Chills    HPI Savannah Banks is a 52 y.o. female.   Patient complains of sore throat, chills, subjective fever that started last night.  She reports postnasal drip.  Denies cough, shortness of breath, wheezing.  She is taking nothing for the symptoms.  Denies sick contacts.    Past Medical History:  Diagnosis Date   COVID-19 09/24/2020    Patient Active Problem List   Diagnosis Date Noted   Abnormal screening mammogram 05/04/2022   Does not feel safe at home 01/13/2021   Atypical nevus 07/20/2019   GERD (gastroesophageal reflux disease) 03/17/2018   Hemochromatosis carrier 01/27/2017   Neoplasm of uncertain behavior 22/10/5425   Elevated transaminase level 10/12/2016   Constipation 10/08/2016   Dysphagia 10/08/2016   Lumbar back pain 10/08/2016   Dyspnea 10/24/2015   Lactose intolerance 10/24/2015   Muscle spasm of back 07/25/2015   Atypical chest pain 07/25/2015   Hyperlipidemia 03/04/2015   Numbness of fingers of both hands 03/04/2015   IUD (intrauterine device) in place 02/11/2015   Preventative health care 02/05/2015   Fibroadenoma of breast 02/05/2015   Dizziness 02/05/2015    Past Surgical History:  Procedure Laterality Date   BREAST BIOPSY Left pt unsure   benign   TONSILLECTOMY      OB History     Gravida  3   Para  1   Term  1   Preterm  0   AB  2   Living  1      SAB  2   IAB      Ectopic      Multiple      Live Births               Home Medications    Prior to Admission medications   Medication Sig Start Date End Date Taking? Authorizing Provider  ibuprofen (ADVIL) 600 MG tablet Take 1 tablet (600 mg total) by mouth 3 (three) times daily. 06/16/22   Nyoka Lint, PA-C  levonorgestrel (MIRENA, 52 MG,) 20 MCG/DAY IUD Take 1 device by intrauterine route.  01/24/20   [provider]  pseudoephedrine (SUDAFED) 30 MG tablet Take 1 tablet (30 mg total) by mouth every 4 (four) hours as needed for congestion. 06/16/22   Nyoka Lint, PA-C  omeprazole (PRILOSEC) 20 MG capsule Take 1 capsule (20 mg total) by mouth every morning. Patient taking differently: Take 20 mg by mouth daily as needed.  06/13/19 08/25/20  Leeanne Rio, MD    Family History Family History  Problem Relation Age of Onset   Cancer Mother        cervical   Diabetes Mother    Hypertension Mother    Cancer Father        liver   Liver cancer Father    Cancer Paternal Grandmother        nose cancer    Social History Social History   Tobacco Use   Smoking status: Never   Smokeless tobacco: Never  Substance Use Topics   Alcohol use: No   Drug use: No     Allergies   Shellfish allergy   Review of Systems Review of Systems  Constitutional:  Positive for chills and fever.  HENT:  Positive for congestion and sore  throat. Negative for ear pain.   Eyes:  Negative for pain and visual disturbance.  Respiratory:  Negative for cough and shortness of breath.   Cardiovascular:  Negative for chest pain and palpitations.  Gastrointestinal:  Negative for abdominal pain and vomiting.  Genitourinary:  Negative for dysuria and hematuria.  Musculoskeletal:  Negative for arthralgias and back pain.  Skin:  Negative for color change and rash.  Neurological:  Negative for seizures and syncope.  All other systems reviewed and are negative.    Physical Exam Triage Vital Signs ED Triage Vitals  Enc Vitals Group     BP 09/08/22 1714 118/75     Pulse Rate 09/08/22 1714 89     Resp 09/08/22 1714 18     Temp 09/08/22 1714 99.2 F (37.3 C)     Temp src --      SpO2 09/08/22 1714 98 %     Weight --      Height --      Head Circumference --      Peak Flow --      Pain Score 09/08/22 1711 10     Pain Loc --      Pain Edu? --      Excl. in [place]? --    No data  found.  Updated Vital Signs BP 118/75   Pulse 89   Temp 99.2 F (37.3 C)   Resp 18   SpO2 98%   Visual Acuity Right Eye Distance:   Left Eye Distance:   Bilateral Distance:    Right Eye Near:   Left Eye Near:    Bilateral Near:     Physical Exam Vitals and nursing note reviewed.  Constitutional:      General: She is not in acute distress.    Appearance: She is well-developed.  HENT:     Head: Normocephalic and atraumatic.     Mouth/Throat:     Pharynx: Posterior oropharyngeal erythema present. No pharyngeal swelling.  Eyes:     Conjunctiva/sclera: Conjunctivae normal.  Cardiovascular:     Rate and Rhythm: Normal rate and regular rhythm.     Heart sounds: No murmur heard. Pulmonary:     Effort: Pulmonary effort is normal. No respiratory distress.     Breath sounds: Normal breath sounds.  Abdominal:     Palpations: Abdomen is soft.     Tenderness: There is no abdominal tenderness.  Musculoskeletal:        General: No swelling.     Cervical back: Neck supple.  Skin:    General: Skin is warm and dry.     Capillary Refill: Capillary refill takes less than 2 seconds.  Neurological:     Mental Status: She is alert.  Psychiatric:        Mood and Affect: Mood normal.      UC Treatments / Results  Labs (all labs ordered are listed, but only abnormal results are displayed) Labs Reviewed  SARS CORONAVIRUS 2 (TAT 6-24 HRS)  POCT RAPID STREP A, ED / UC  POC INFLUENZA A AND B ANTIGEN (URGENT CARE ONLY)    EKG   Radiology No results found.  Procedures Procedures (including critical care time)  Medications Ordered in UC Medications - No data to display  Initial Impression / Assessment and Plan / UC Course  I have reviewed the triage vital signs and the nursing notes.  Pertinent labs & imaging results that were available during my care of the patient were reviewed by  me and considered in my medical decision making (see chart for details).     Viral  pharyngitis.  Negative strep in clinic today.  Flu test positive, Tamiflu sent to pharmacy.  Supportive care discussed.  Return precautions discussed.  Patient overall well-appearing in no acute distress, vitals within normal limits. Final Clinical Impressions(s) / UC Diagnoses   Final diagnoses:  Acute pharyngitis, unspecified etiology     Discharge Instructions      COVID and flu test pending. Recommend warm salt water gargles. Recommend Flonase and Mucinex. Can take ibuprofen as needed for discomfort.    ED Prescriptions   None    PDMP not reviewed this encounter.   Ward, Lenise Arena, PA-C 09/08/22 1807    Ward, Lenise Arena, PA-C 09/08/22 1930

## 2022-09-08 NOTE — Discharge Instructions (Addendum)
Tamiflu was sent to the pharmacy.  Recommend warm salt water gargles. Recommend Flonase and Mucinex. Can take ibuprofen as needed for discomfort.

## 2022-09-09 LAB — SARS CORONAVIRUS 2 (TAT 6-24 HRS): SARS Coronavirus 2: NEGATIVE

## 2022-11-23 ENCOUNTER — Ambulatory Visit: Payer: 59 | Admitting: Family Medicine

## 2022-11-23 ENCOUNTER — Encounter: Payer: Self-pay | Admitting: Family Medicine

## 2022-11-23 VITALS — BP 115/70 | HR 73 | Temp 97.8°F | Ht 59.0 in | Wt 132.8 lb

## 2022-11-23 DIAGNOSIS — Z23 Encounter for immunization: Secondary | ICD-10-CM | POA: Diagnosis not present

## 2022-11-23 DIAGNOSIS — Z1211 Encounter for screening for malignant neoplasm of colon: Secondary | ICD-10-CM | POA: Diagnosis not present

## 2022-11-23 DIAGNOSIS — Z0000 Encounter for general adult medical examination without abnormal findings: Secondary | ICD-10-CM | POA: Diagnosis not present

## 2022-11-23 MED ORDER — SHINGRIX 50 MCG/0.5ML IM SUSR: INTRAMUSCULAR | 1 refills | Status: AC

## 2022-11-23 NOTE — Patient Instructions (Signed)
Follow up in 1 year for next physical

## 2022-11-23 NOTE — Progress Notes (Signed)
  Date of Visit: 11/23/2022   SUBJECTIVE:   HPI:  Savannah Banks presents today for a well woman exam. [place] interpreter utilized during this visit.   Concerns today: none Periods: monthly, light Contraception: IUD Pelvic symptoms: no vaginal discharge or pelvic pain STD Screening: no Pap smear status: UTD Exercise: no, encouraged but not interested Diet: vegetarian Smoking: no Alcohol: no Drugs: no Work: Scientist, forensic in [employer] Mood: overall good, denies issues Safety: feels safe at home Dentist: goes every 6 months Cancers in family:  - dad liver cancer - mom & two aunts - cervical cancer - paternal grandmother nose cancer - maternal uncle throat cancer - another uncle with ? Skin cancer  OBJECTIVE:   BP 115/70   Pulse 73   Temp 97.8 F (36.6 C)   Ht 4\' 11"  (1.499 m)   Wt 132 lb 12.8 oz (60.2 kg)   SpO2 98%   BMI 26.82 kg/m  Gen: NAD, pleasant, cooperative HEENT: NCAT, PERRL, no palpable thyromegaly or anterior cervical lymphadenopathy Heart: RRR, no murmurs Lungs: CTAB, NWOB Abdomen: soft, nontender to palpation Neuro: grossly nonfocal, speech normal Extremities: No appreciable lower extremity edema bilaterally   ASSESSMENT/PLAN:   Health maintenance:  -STD screening: declines -pap smear: UTD -mammogram: has scheduled in June via GYN office -lipid screening: UTD -colon cancer screening: referral entered to GI -immunizations:  Flu: given today Tdap: UTD Shingrix: rx given to get at pharmacy COVID: declines booster today -handout given on health maintenance topics  FOLLOW UP: Follow up in 1 year for next CPE  Savannah Banks, [place]

## 2023-02-10 DIAGNOSIS — Z1231 Encounter for screening mammogram for malignant neoplasm of breast: Secondary | ICD-10-CM | POA: Diagnosis not present

## 2023-02-10 LAB — HM MAMMOGRAPHY

## 2023-03-04 DIAGNOSIS — Z130 Encounter for screening for diseases of the blood and blood-forming organs and certain disorders involving the immune mechanism: Secondary | ICD-10-CM | POA: Diagnosis not present

## 2023-03-04 DIAGNOSIS — Z30431 Encounter for routine checking of intrauterine contraceptive device: Secondary | ICD-10-CM | POA: Diagnosis not present

## 2023-03-04 DIAGNOSIS — Z01419 Encounter for gynecological examination (general) (routine) without abnormal findings: Secondary | ICD-10-CM | POA: Diagnosis not present

## 2023-03-04 DIAGNOSIS — Z1389 Encounter for screening for other disorder: Secondary | ICD-10-CM | POA: Diagnosis not present

## 2023-03-15 ENCOUNTER — Encounter: Payer: Self-pay | Admitting: Internal Medicine

## 2023-04-07 ENCOUNTER — Encounter: Payer: Self-pay | Admitting: Internal Medicine

## 2023-04-07 ENCOUNTER — Ambulatory Visit (AMBULATORY_SURGERY_CENTER): Payer: 59

## 2023-04-07 VITALS — Ht 62.0 in | Wt 137.0 lb

## 2023-04-07 DIAGNOSIS — Z1211 Encounter for screening for malignant neoplasm of colon: Secondary | ICD-10-CM

## 2023-04-07 MED ORDER — NA SULFATE-K SULFATE-MG SULF 17.5-3.13-1.6 GM/177ML PO SOLN: 1.0000 | Freq: Once | ORAL | 0 refills | Status: AC

## 2023-04-07 NOTE — Progress Notes (Signed)

## 2023-04-28 ENCOUNTER — Encounter: Payer: Self-pay | Admitting: Internal Medicine

## 2023-04-28 ENCOUNTER — Ambulatory Visit (AMBULATORY_SURGERY_CENTER): Payer: 59 | Admitting: Internal Medicine

## 2023-04-28 VITALS — BP 127/76 | HR 67 | Temp 98.0°F | Resp 19 | Ht 62.0 in | Wt 137.0 lb

## 2023-04-28 DIAGNOSIS — D123 Benign neoplasm of transverse colon: Secondary | ICD-10-CM

## 2023-04-28 DIAGNOSIS — Z1211 Encounter for screening for malignant neoplasm of colon: Secondary | ICD-10-CM

## 2023-04-28 DIAGNOSIS — K635 Polyp of colon: Secondary | ICD-10-CM

## 2023-04-28 MED ORDER — SODIUM CHLORIDE 0.9 % IV SOLN: 500.0000 mL | Freq: Once | INTRAVENOUS | Status: DC

## 2023-04-28 NOTE — Progress Notes (Signed)
Sedate, gd SR, tolerated procedure well, VSS, report to RN 

## 2023-04-28 NOTE — Progress Notes (Signed)
GASTROENTEROLOGY PROCEDURE H&P NOTE   Primary Care Physician: Latrelle Dodrill, MD    Reason for Procedure:   Colon cancer screening  Plan:    Colonoscopy  Patient is appropriate for endoscopic procedure(s) in the ambulatory ([facility]) setting.  The nature of the procedure, as well as the risks, benefits, and alternatives were carefully and thoroughly reviewed with the patient. Ample time for discussion and questions allowed. The patient understood, was satisfied, and agreed to proceed.     HPI: Savannah Banks is a 52 y.o. female who presents for colonoscopy for colon cancer screening. Denies blood in stools, changes in bowel habits, or unintentional weight loss. Denies family history of colon cancer.  Past Medical History:  Diagnosis Date   Allergy    COVID-19 09/24/2020    Past Surgical History:  Procedure Laterality Date   BREAST BIOPSY Left pt unsure   benign   TONSILLECTOMY      Prior to Admission medications   Medication Sig Start Date End Date Taking? Authorizing Provider  levonorgestrel (MIRENA, 52 MG,) 20 MCG/DAY IUD Take 1 device by intrauterine route. 01/24/20  Yes [provider]  Zoster Vaccine Adjuvanted [facility]) injection Administer Shingrix vaccination now and repeat in two months 11/23/22   Latrelle Dodrill, MD  omeprazole (PRILOSEC) 20 MG capsule Take 1 capsule (20 mg total) by mouth every morning. Patient taking differently: Take 20 mg by mouth daily as needed. 06/13/19 08/25/20  Latrelle Dodrill, MD    Current Outpatient Medications  Medication Sig Dispense Refill   levonorgestrel (MIRENA, 52 MG,) 20 MCG/DAY IUD Take 1 device by intrauterine route.     Zoster Vaccine Adjuvanted [facility]) injection Administer Shingrix vaccination now and repeat in two months 1 each 1   Current Facility-Administered Medications  Medication Dose Route Frequency Provider Last Rate Last Admin   0.9 %  sodium chloride infusion  500 mL Intravenous Once  Imogene Burn, MD        Allergies as of 04/28/2023 - Review Complete 04/28/2023  Allergen Reaction Noted   Shellfish allergy Rash 12/03/2015    Family History  Problem Relation Age of Onset   Ovarian cancer Mother    Cancer Mother        cervical   Diabetes Mother    Hypertension Mother    Cancer Father        liver   Liver cancer Father    Esophageal cancer Paternal Uncle    Cancer Paternal Grandmother        nose cancer   Colon cancer Neg Hx    Colon polyps Neg Hx    Rectal cancer Neg Hx    Stomach cancer Neg Hx     Social History   Socioeconomic History   Marital status: Married    Spouse name: Not on file   Number of children: Not on file   Years of education: Not on file   Highest education level: Not on file  Occupational History   Not on file  Tobacco Use   Smoking status: Never   Smokeless tobacco: Never  Substance and Sexual Activity   Alcohol use: No   Drug use: No   Sexual activity: Yes    Birth control/protection: I.U.D.  Other Topics Concern   Not on file  Social History Narrative   02/05/15   Born in [place]   Lives with daughter and Husband   Social Determinants of Health   Financial Resource Strain: Not on file  Food Insecurity: Not on file  Transportation Needs: Not on file  Physical Activity: Not on file  Stress: Not on file  Social Connections: Not on file  Intimate Partner Violence: Not on file    Physical Exam: Vital signs in last 24 hours: BP 105/62   Pulse 65   Temp 98 F (36.7 C)   Ht 5\' 2"  (1.575 m)   Wt 137 lb (62.1 kg)   SpO2 99%   BMI 25.06 kg/m  GEN: NAD EYE: Sclerae anicteric ENT: MMM CV: Non-tachycardic Pulm: No increased work of breathing GI: Soft, NT/ND NEURO:  Alert & Oriented   Eulah Pont, MD [facility]  04/28/2023 9:10 AM

## 2023-04-28 NOTE — Op Note (Signed)
[facility] Patient Name: Savannah Banks Procedure Date: 04/28/2023 9:04 AM MRN: [id number] Endoscopist: Madelyn Brunner [place] , , [phone] Age: 52 Referring MD:  Date of Birth: [birth year] Gender: Female Account #: [id number] Procedure:                Colonoscopy Indications:              Screening for colorectal malignant neoplasm, This                            is the patient's first colonoscopy Medicines:                Monitored Anesthesia Care Procedure:                Pre-Anesthesia Assessment:                           - Prior to the procedure, a History and Physical                            was performed, and patient medications and                            allergies were reviewed. The patient's tolerance of                            previous anesthesia was also reviewed. The risks                            and benefits of the procedure and the sedation                            options and risks were discussed with the patient.                            All questions were answered, and informed consent                            was obtained. Prior Anticoagulants: The patient has                            taken no anticoagulant or antiplatelet agents. ASA                            Grade Assessment: II - A patient with mild systemic                            disease. After reviewing the risks and benefits,                            the patient was deemed in satisfactory condition to                            undergo the procedure.  After obtaining informed consent, the colonoscope                            was passed under direct vision. Throughout the                            procedure, the patient's blood pressure, pulse, and                            oxygen saturations were monitored continuously. The                            [id number]-HQ190L Colonoscope [id number] was introduced                            through the anus and advanced  to the the terminal                            ileum. The colonoscopy was performed without                            difficulty. The patient tolerated the procedure                            well. The quality of the bowel preparation was                            good. The terminal ileum, ileocecal valve,                            appendiceal orifice, and rectum were photographed. Scope In: 9:26:22 AM Scope Out: 9:41:22 AM Scope Withdrawal Time: 0 hours 7 minutes 26 seconds  Total Procedure Duration: 0 hours 15 minutes 0 seconds  Findings:                 The terminal ileum appeared normal.                           A 6 mm polyp was found in the transverse colon. The                            polyp was sessile. The polyp was removed with a                            cold snare. Resection and retrieval were complete.                           Non-bleeding internal hemorrhoids were found during                            retroflexion. The hemorrhoids were small. Complications:            No immediate complications. Estimated Blood Loss:     Estimated blood loss was minimal. Impression:               -  The examined portion of the ileum was normal.                           - One 6 mm polyp in the transverse colon, removed                            with a cold snare. Resected and retrieved.                           - Non-bleeding internal hemorrhoids. Recommendation:           - Discharge patient to home (with escort).                           - Await pathology results.                           - The findings and recommendations were discussed                            with the patient. Dr Particia Lather "[place]" [place],  04/28/2023 9:44:04 AM

## 2023-04-28 NOTE — Patient Instructions (Signed)
Resume all of your previous medications as ordered.  Read the handouts given to you by your recovery room nurse.  YOU HAD AN ENDOSCOPIC PROCEDURE TODAY AT THE [facility]:   Refer to the procedure report that was given to you for any specific questions about what was found during the examination.  If the procedure report does not answer your questions, please call your gastroenterologist to clarify.  If you requested that your care partner not be given the details of your procedure findings, then the procedure report has been included in a sealed envelope for you to review at your convenience later.  YOU SHOULD EXPECT: Some feelings of bloating in the abdomen. Passage of more gas than usual.  Walking can help get rid of the air that was put into your GI tract during the procedure and reduce the bloating. If you had a lower endoscopy (such as a colonoscopy or flexible sigmoidoscopy) you may notice spotting of blood in your stool or on the toilet paper. If you underwent a bowel prep for your procedure, you may not have a normal bowel movement for a few days.  Please Note:  You might notice some irritation and congestion in your nose or some drainage.  This is from the oxygen used during your procedure.  There is no need for concern and it should clear up in a day or so.  SYMPTOMS TO REPORT IMMEDIATELY:  Following lower endoscopy (colonoscopy or flexible sigmoidoscopy):  Excessive amounts of blood in the stool  Significant tenderness or worsening of abdominal pains  Swelling of the abdomen that is new, acute  Fever of 100F or higher  For urgent or emergent issues, a gastroenterologist can be reached at any hour by calling [phone]. Do not use MyChart messaging for urgent concerns.    DIET:  We do recommend a small meal at first, but then you may proceed to your regular diet.  Drink plenty of fluids but you should avoid alcoholic beverages for 24 hours.  ACTIVITY:  You should  plan to take it easy for the rest of today and you should NOT DRIVE or use heavy machinery until tomorrow (because of the sedation medicines used during the test).    FOLLOW UP: Our staff will call the number listed on your records the next business day following your procedure.  We will call around 7:15- 8:00 am to check on you and address any questions or concerns that you may have regarding the information given to you following your procedure. If we do not reach you, we will leave a message.     If any biopsies were taken you will be contacted by phone or by letter within the next 1-3 weeks.  Please call us at [phone] if you have not heard about the biopsies in 3 weeks.    SIGNATURES/CONFIDENTIALITY: You and/or your care partner have signed paperwork which will be entered into your electronic medical record.  These signatures attest to the fact that that the information above on your After Visit Summary has been reviewed and is understood.  Full responsibility of the confidentiality of this discharge information lies with you and/or your care-partner.

## 2023-04-28 NOTE — Progress Notes (Signed)
Called to room to assist during endoscopic procedure.  Patient ID and intended procedure confirmed with present staff. Received instructions for my participation in the procedure from the performing physician.  

## 2023-04-28 NOTE — Progress Notes (Signed)
Pt's states no medical or surgical changes since previsit or office visit. [place] interpreter [place] helped with admission.

## 2023-04-29 ENCOUNTER — Telehealth: Payer: Self-pay | Admitting: *Deleted

## 2023-04-29 NOTE — Telephone Encounter (Signed)
  Follow up Call-     04/28/2023    7:50 AM  Call back number  Post procedure Call Back phone  # [phone]  Permission to leave phone message Yes     Patient questions:  Do you have a fever, pain , or abdominal swelling? No. Pain Score  0 *  Have you tolerated food without any problems? Yes.    Have you been able to return to your normal activities? Yes.    Do you have any questions about your discharge instructions: Diet   No. Medications  No. Follow up visit  No.  Do you have questions or concerns about your Care? No.  Actions: * If pain score is 4 or above: No action needed, pain <4.

## 2023-04-30 ENCOUNTER — Encounter: Payer: Self-pay | Admitting: Internal Medicine

## 2024-02-07 ENCOUNTER — Ambulatory Visit (INDEPENDENT_AMBULATORY_CARE_PROVIDER_SITE_OTHER): Admitting: Family Medicine

## 2024-02-07 ENCOUNTER — Encounter: Payer: Self-pay | Admitting: Family Medicine

## 2024-02-07 VITALS — BP 114/72 | HR 93 | Ht 62.0 in | Wt 127.0 lb

## 2024-02-07 DIAGNOSIS — Z0000 Encounter for general adult medical examination without abnormal findings: Secondary | ICD-10-CM | POA: Diagnosis not present

## 2024-02-07 DIAGNOSIS — Z13220 Encounter for screening for lipoid disorders: Secondary | ICD-10-CM

## 2024-02-07 DIAGNOSIS — J302 Other seasonal allergic rhinitis: Secondary | ICD-10-CM | POA: Insufficient documentation

## 2024-02-07 MED ORDER — CETIRIZINE HCL 10 MG PO TABS: 10.0000 mg | ORAL_TABLET | Freq: Every day | ORAL | 1 refills | Status: DC

## 2024-02-07 MED ORDER — FLUTICASONE PROPIONATE 50 MCG/ACT NA SUSP: 2.0000 | Freq: Every day | NASAL | 1 refills | Status: DC

## 2024-02-07 NOTE — Assessment & Plan Note (Signed)
 Symptoms consistent with seasonal allergies Rx zyrtec  and flonase Follow up if not improving

## 2024-02-07 NOTE — Progress Notes (Signed)
  Date of Visit: 02/07/2024   SUBJECTIVE:   HPI:  Roxene presents today for a well woman exam. [place] interpreter utilized during this visit.   Concerns today: itchy sore throat, see below Periods: no, has IUD Contraception: IUD Pelvic symptoms: denies pelvic pain or vaginal discharge STD Screening: declines today Pap smear status: UTD Exercise: not much regular exercise, counseled Smoking: no Alcohol: no Drugs: no Mood: no concerns, doing well Dentist: goes to dentist every 6 months Cancers in family: no updates from last year - dad liver cancer - mom & two aunts - cervical cancer - paternal grandmother nose cancer - maternal uncle throat cancer - another uncle with ? Skin cancer  Sore throat/neck pain x1 week Sprayed to kill weeds in front of yard No fever + drainage/congestion in nose Throat itchy Has not tried any allergy medications   For about a month has had chills in head, feet, body - advised to schedule separate visit to address  OBJECTIVE:   BP 114/72   Pulse 93   Ht 5\' 2"  (1.575 m)   Wt 127 lb (57.6 kg)   SpO2 99%   BMI 23.23 kg/m  Gen: NAD, pleasant, cooperative HEENT: NCAT, PERRL, no palpable thyromegaly or anterior cervical lymphadenopathy Heart: RRR, no murmurs Lungs: CTAB, NWOB Abdomen: soft, nontender to palpation Neuro: grossly nonfocal, speech normal  ASSESSMENT/PLAN:    Assessment & Plan Routine adult health maintenance -STD screening: declines today -pap smear: UTD through GYN -mammogram: UTD t hrough GYN office -lipid screening: lipids and CMET today -colon cancer screening: UTD -immunizations:  Tdap: UTD Shingrix : UTD -handout given on health maintenance topics Seasonal allergies Symptoms consistent with seasonal allergies Rx zyrtec  and flonase Follow up if not improving  FOLLOW UP: Follow up in 1 year for next CPE, sooner if needed  [place] J. Dawn Eth, MD [facility]

## 2024-02-08 LAB — CMP14+EGFR
ALT: 47 IU/L — ABNORMAL HIGH (ref 0–32)
AST: 26 IU/L (ref 0–40)
Albumin: 4.4 g/dL (ref 3.8–4.9)
Alkaline Phosphatase: 104 IU/L (ref 44–121)
BUN/Creatinine Ratio: 13 (ref 9–23)
BUN: 7 mg/dL (ref 6–24)
Bilirubin Total: 0.3 mg/dL (ref 0.0–1.2)
CO2: 23 mmol/L (ref 20–29)
Calcium: 9 mg/dL (ref 8.7–10.2)
Chloride: 100 mmol/L (ref 96–106)
Creatinine, Ser: 0.55 mg/dL — ABNORMAL LOW (ref 0.57–1.00)
Globulin, Total: 2.5 g/dL (ref 1.5–4.5)
Glucose: 82 mg/dL (ref 70–99)
Potassium: 4.3 mmol/L (ref 3.5–5.2)
Sodium: 139 mmol/L (ref 134–144)
Total Protein: 6.9 g/dL (ref 6.0–8.5)
eGFR: 111 mL/min/{1.73_m2} (ref >59)

## 2024-02-08 LAB — LIPID PANEL
Chol/HDL Ratio: 3.8 ratio (ref 0.0–4.4)
Cholesterol, Total: 162 mg/dL (ref 100–199)
HDL: 43 mg/dL (ref >39)
LDL Chol Calc (NIH): 96 mg/dL (ref 0–99)
Triglycerides: 131 mg/dL (ref 0–149)
VLDL Cholesterol Cal: 23 mg/dL (ref 5–40)

## 2024-03-07 DIAGNOSIS — Z1231 Encounter for screening mammogram for malignant neoplasm of breast: Secondary | ICD-10-CM | POA: Diagnosis not present

## 2024-03-07 DIAGNOSIS — Z01419 Encounter for gynecological examination (general) (routine) without abnormal findings: Secondary | ICD-10-CM | POA: Diagnosis not present

## 2024-03-07 DIAGNOSIS — Z130 Encounter for screening for diseases of the blood and blood-forming organs and certain disorders involving the immune mechanism: Secondary | ICD-10-CM | POA: Diagnosis not present

## 2024-03-07 DIAGNOSIS — Z1389 Encounter for screening for other disorder: Secondary | ICD-10-CM | POA: Diagnosis not present

## 2024-03-07 DIAGNOSIS — Z30431 Encounter for routine checking of intrauterine contraceptive device: Secondary | ICD-10-CM | POA: Diagnosis not present

## 2024-03-27 ENCOUNTER — Ambulatory Visit: Payer: Self-pay | Admitting: Family Medicine

## 2024-04-09 ENCOUNTER — Other Ambulatory Visit: Payer: Self-pay | Admitting: Family Medicine

## 2024-05-31 ENCOUNTER — Other Ambulatory Visit: Payer: Self-pay | Admitting: Family Medicine

## 2024-06-04 ENCOUNTER — Other Ambulatory Visit: Payer: Self-pay | Admitting: Family Medicine

## 2024-08-05 ENCOUNTER — Other Ambulatory Visit: Payer: Self-pay | Admitting: Family Medicine

## 2024-08-16 ENCOUNTER — Encounter: Payer: Self-pay | Admitting: Family Medicine

## 2024-10-06 ENCOUNTER — Other Ambulatory Visit: Payer: Self-pay | Admitting: Family Medicine
